# Patient Record
Sex: Male | Born: 1965 | Race: White | Hispanic: No | Marital: Single | Smoking: Former smoker
Health system: Southern US, Community
[De-identification: ages and names within clinical notes are randomized; demographics above are authoritative.]

## PROBLEM LIST (undated history)

## (undated) DIAGNOSIS — E782 Mixed hyperlipidemia: Secondary | ICD-10-CM

## (undated) DIAGNOSIS — G43909 Migraine, unspecified, not intractable, without status migrainosus: Secondary | ICD-10-CM

## (undated) DIAGNOSIS — K219 Gastro-esophageal reflux disease without esophagitis: Secondary | ICD-10-CM

## (undated) DIAGNOSIS — I25118 Atherosclerotic heart disease of native coronary artery with other forms of angina pectoris: Secondary | ICD-10-CM

## (undated) HISTORY — DX: Migraine, unspecified, not intractable, without status migrainosus: G43.909

## (undated) HISTORY — DX: Gastro-esophageal reflux disease without esophagitis: K21.9

## (undated) HISTORY — DX: Atherosclerotic heart disease of native coronary artery with other forms of angina pectoris: I25.118

## (undated) HISTORY — DX: Mixed hyperlipidemia: E78.2

---

## 2015-04-04 DIAGNOSIS — G44209 Tension-type headache, unspecified, not intractable: Secondary | ICD-10-CM | POA: Insufficient documentation

## 2015-04-04 HISTORY — DX: Tension-type headache, unspecified, not intractable: G44.209

## 2018-06-15 ENCOUNTER — Other Ambulatory Visit: Payer: Self-pay

## 2018-06-15 ENCOUNTER — Ambulatory Visit: Payer: 59 | Admitting: Podiatry

## 2018-06-15 ENCOUNTER — Ambulatory Visit (INDEPENDENT_AMBULATORY_CARE_PROVIDER_SITE_OTHER): Payer: 59

## 2018-06-15 DIAGNOSIS — M7752 Other enthesopathy of left foot: Secondary | ICD-10-CM

## 2018-06-15 DIAGNOSIS — M722 Plantar fascial fibromatosis: Secondary | ICD-10-CM

## 2018-06-15 DIAGNOSIS — M216X9 Other acquired deformities of unspecified foot: Secondary | ICD-10-CM | POA: Diagnosis not present

## 2018-06-15 DIAGNOSIS — M79672 Pain in left foot: Secondary | ICD-10-CM | POA: Diagnosis not present

## 2018-06-15 MED ORDER — MELOXICAM 15 MG PO TABS: 15.0000 mg | ORAL_TABLET | Freq: Every day | ORAL | 0 refills | Status: DC

## 2018-06-15 NOTE — Patient Instructions (Signed)

## 2018-06-15 NOTE — Progress Notes (Signed)
   Subjective:    Patient ID: Matthew Patel, male    DOB: 01-Jul-1966, 52 y.o.   MRN: [id number]  HPI    Review of Systems  Musculoskeletal: Positive for arthralgias and myalgias.  All other systems reviewed and are negative.      Objective:   Physical Exam        Assessment & Plan:

## 2018-06-21 NOTE — Progress Notes (Signed)
  Subjective:  Patient ID: Matthew Patel, male    DOB: 03-May-1966,  MRN: [id number]  Chief Complaint  Patient presents with  . Foot Pain    L mid arch and bottoom heel x 2 mo; 6/10 stabbing pain -no injury Tx: ibuprofen -the more I walk or stand on it the pain gets worst   52 y.o. male presents with the above complaint.  History as above  Review of Systems: Negative except as noted in the HPI. Denies N/V/F/Ch.  No past medical history on file.  Current Outpatient Medications:  .  Adalimumab 40 MG/0.8ML PSKT, Inject into the skin., Disp: , Rfl:  .  predniSONE (STERAPRED UNI-PAK 21 TAB) 5 MG (21) TBPK tablet, TAKE 6 TABLETS ON DAY 1 AS DIRECTED ON PACKAGE AND DECREASE BY 1 TAB EACH DAY FOR A TOTAL OF 6 DAYS, Disp: , Rfl: 0 .  TREMFYA 100 MG/ML SOSY, , Disp: , Rfl:  .  meloxicam (MOBIC) 15 MG tablet, Take 1 tablet (15 mg total) by mouth daily., Disp: 30 tablet, Rfl: 0  Social History   Tobacco Use  Smoking Status Not on file    Allergies  Allergen Reactions  . Ranitidine Swelling   Objective:  There were no vitals filed for this visit. There is no height or weight on file to calculate BMI. Constitutional Well developed. Well nourished.  Vascular Dorsalis pedis pulses palpable bilaterally. Posterior tibial pulses palpable bilaterally. Capillary refill normal to all digits.  No cyanosis or clubbing noted. Pedal hair growth normal.  Neurologic Normal speech. Oriented to person, place, and time. Epicritic sensation to light touch grossly present bilaterally.  Dermatologic Nails well groomed and normal in appearance. No open wounds. No skin lesions.  Orthopedic: Normal joint ROM without pain or crepitus bilaterally. No visible deformities. Tender to palpation at the calcaneal tuber left. No pain with calcaneal squeeze left. Ankle ROM diminished range of motion left. Silfverskiold Test: positive left.   Radiographs: Taken and reviewed. No acute fractures or dislocations.  No evidence of stress fracture.  Plantar heel spur absent. Posterior heel spur present.   Assessment:   1. Plantar fasciitis   2. Equinus deformity of foot   3. Bursitis of heel, left   4. Pain of left heel    Plan:  Patient was evaluated and treated and all questions answered.  Plantar Fasciitis, left - XR reviewed as above.  - Educated on icing and stretching. Instructions given.  - Injection delivered to the plantar fascia as below. - Pharmacologic management: Meloxicam. Educated on risks/benefits and proper taking of medication.  Procedure: Injection Tendon/Ligament Location: Left plantar fascia at the glabrous junction; medial approach. Skin Prep: alcohol Injectate: 1 cc 0.5% marcaine plain, 1 cc dexamethasone phosphate, 0.5 cc kenalog 10. Disposition: Patient tolerated procedure well. Injection site dressed with a band-aid.  No follow-ups on file.

## 2018-06-22 ENCOUNTER — Other Ambulatory Visit: Payer: Self-pay | Admitting: Podiatry

## 2018-06-22 DIAGNOSIS — M722 Plantar fascial fibromatosis: Secondary | ICD-10-CM

## 2018-06-22 DIAGNOSIS — M216X9 Other acquired deformities of unspecified foot: Secondary | ICD-10-CM

## 2018-06-22 DIAGNOSIS — M7752 Other enthesopathy of left foot: Secondary | ICD-10-CM

## 2018-07-06 ENCOUNTER — Ambulatory Visit: Payer: 59 | Admitting: Podiatry

## 2018-07-07 ENCOUNTER — Other Ambulatory Visit: Payer: Self-pay | Admitting: Podiatry

## 2018-08-22 ENCOUNTER — Other Ambulatory Visit: Payer: Self-pay | Admitting: Podiatry

## 2019-11-12 ENCOUNTER — Encounter: Payer: Self-pay | Admitting: Legal Medicine

## 2019-11-12 ENCOUNTER — Ambulatory Visit: Payer: Managed Care, Other (non HMO) | Admitting: Legal Medicine

## 2019-11-12 ENCOUNTER — Other Ambulatory Visit: Payer: Self-pay

## 2019-11-12 DIAGNOSIS — E782 Mixed hyperlipidemia: Secondary | ICD-10-CM

## 2019-11-12 DIAGNOSIS — K2100 Gastro-esophageal reflux disease with esophagitis, without bleeding: Secondary | ICD-10-CM

## 2019-11-12 DIAGNOSIS — K219 Gastro-esophageal reflux disease without esophagitis: Secondary | ICD-10-CM | POA: Insufficient documentation

## 2019-11-12 DIAGNOSIS — J449 Chronic obstructive pulmonary disease, unspecified: Secondary | ICD-10-CM

## 2019-11-12 DIAGNOSIS — J4489 Other specified chronic obstructive pulmonary disease: Secondary | ICD-10-CM

## 2019-11-12 HISTORY — DX: Chronic obstructive pulmonary disease, unspecified: J44.9

## 2019-11-12 LAB — PULMONARY FUNCTION TEST
FEV1/FVC: 48 %
FEV1: 0.15 L
FVC: 0.18 L

## 2019-11-12 NOTE — Assessment & Plan Note (Signed)

## 2019-11-12 NOTE — Patient Instructions (Signed)

## 2019-11-12 NOTE — Assessment & Plan Note (Signed)
Plan of care was formulated today.  She is doing poorly.  A plan of care was formulated using patient exam, tests and other sources to optimize care using evidence based information.  Recommend no smoking, no eating after supper, avoid fatty foods, elevate Head of bed, avoid tight fitting clothing.  Continue on dexilant.

## 2019-11-12 NOTE — Progress Notes (Signed)
Established Patient Office Visit  Subjective:  Patient ID: Matthew Patel, male    DOB: September 01, 1965  Age: 54 y.o. MRN: [id number]  CC:  Chief Complaint  Patient presents with  . Gastroesophageal Reflux    HPI Quaid Yeakle Gural presents for reflux, he complains of SOB and low O2 sat.  He is a chronic smoker and smells heavily of cigarette smoke.  Patient has gastroesophageal reflux symptoms withesophagitis and LTRD.  The symptoms are severe intensity.  Length of symptoms 5 years.  Medicines include prilosec '40mg'$ .  Complications include possible stricture.  Past Medical History:  Diagnosis Date  . GERD (gastroesophageal reflux disease)   . Migraines   . Mixed hyperlipidemia     History reviewed. No pertinent surgical history.  Family History  Family history unknown: Yes    Social History   Socioeconomic History  . Marital status: Single    Spouse name: Not on file  . Number of children: Not on file  . Years of education: Not on file  . Highest education level: Not on file  Occupational History  . Not on file  Tobacco Use  . Smoking status: Current Every Day Smoker    Packs/day: 1.50    Types: Cigarettes  . Smokeless tobacco: Never Used  Substance and Sexual Activity  . Alcohol use: Never  . Drug use: Never  . Sexual activity: Not on file  Other Topics Concern  . Not on file  Social History Narrative  . Not on file   Social Determinants of Health   Financial Resource Strain:   . Difficulty of Paying Living Expenses:   Food Insecurity:   . Worried About Charity fundraiser in the Last Year:   . Arboriculturist in the Last Year:   Transportation Needs:   . Film/video editor (Medical):   Marland Kitchen Lack of Transportation (Non-Medical):   Physical Activity:   . Days of Exercise per Week:   . Minutes of Exercise per Session:   Stress:   . Feeling of Stress :   Social Connections:   . Frequency of Communication with Friends and Family:   . Frequency of Social  Gatherings with Friends and Family:   . Attends Religious Services:   . Active Member of Clubs or Organizations:   . Attends Archivist Meetings:   Marland Kitchen Marital Status:   Intimate Partner Violence:   . Fear of Current or Ex-Partner:   . Emotionally Abused:   Marland Kitchen Physically Abused:   . Sexually Abused:     Outpatient Medications Prior to Visit  Medication Sig Dispense Refill  . fexofenadine (ALLEGRA) 180 MG tablet Take 180 mg by mouth daily.    Marland Kitchen omeprazole (PRILOSEC) 40 MG capsule Take 40 mg by mouth 2 (two) times daily before a meal.    . Secukinumab (COSENTYX, 300 MG DOSE, [place]) Inject 300 mg into the skin once a week.    . Adalimumab 40 MG/0.8ML PSKT Inject into the skin.    . meloxicam (MOBIC) 15 MG tablet TAKE 1 TABLET BY MOUTH EVERY DAY 30 tablet 0  . predniSONE (STERAPRED UNI-PAK 21 TAB) 5 MG (21) TBPK tablet TAKE 6 TABLETS ON DAY 1 AS DIRECTED ON PACKAGE AND DECREASE BY 1 TAB EACH DAY FOR A TOTAL OF 6 DAYS  0  . TREMFYA 100 MG/ML SOSY      No facility-administered medications prior to visit.    Allergies  Allergen Reactions  . Ranitidine Swelling  ROS Review of Systems  Constitutional: Negative.   HENT: Negative.   Eyes: Negative.   Respiratory: Negative.   Cardiovascular: Negative.   Gastrointestinal:       Burning from reflux  Endocrine: Negative.   Genitourinary: Negative.   Musculoskeletal: Negative.   Hematological: Negative.   Psychiatric/Behavioral: Negative.       Objective:    Physical Exam  Constitutional: He is oriented to person, place, and time. He appears well-developed and well-nourished.  HENT:  Head: Normocephalic and atraumatic.  Right Ear: External ear normal.  Eyes: Pupils are equal, round, and reactive to light. Conjunctivae and EOM are normal.  Cardiovascular: Normal rate, regular rhythm and normal heart sounds.  Pulmonary/Chest: Effort normal and breath sounds normal.  Abdominal: Soft. Bowel sounds are normal. There is  abdominal tenderness.  Musculoskeletal:        General: Normal range of motion.  Neurological: He is alert and oriented to person, place, and time. He has normal reflexes.  Skin: Skin is dry.  Psychiatric: He has a normal mood and affect. His behavior is normal.  Vitals reviewed.   BP 118/78   Pulse 87   Temp 97.7 F (36.5 C)   Ht '5\' 7"'$  (1.702 m)   Wt 155 lb 3.2 oz (70.4 kg)   SpO2 94%   BMI 24.31 kg/m  Wt Readings from Last 3 Encounters:  11/12/19 155 lb 3.2 oz (70.4 kg)     Health Maintenance Due  Topic Date Due  . HIV Screening  Never done  . TETANUS/TDAP  Never done  . COLONOSCOPY  Never done    There are no preventive care reminders to display for this patient.  No results found for: TSH No results found for: WBC, HGB, HCT, MCV, PLT No results found for: NA, K, CHLORIDE, CO2, GLUCOSE, BUN, CREATININE, BILITOT, ALKPHOS, AST, ALT, PROT, ALBUMIN, CALCIUM, ANIONGAP, EGFR, GFR No results found for: CHOL No results found for: HDL No results found for: LDLCALC No results found for: TRIG No results found for: CHOLHDL No results found for: HGBA1C    Assessment & Plan:   Problem List Items Addressed This Visit      Respiratory   Obstructive chronic bronchitis without exacerbation ([place])    An individualize plan was formulated for care of COPD.  Treatment is evidence based.  She will continue on inhalers, avoid smoking and smoke.  Regular exercise with help with dyspnea. Routine follow ups and medication compliance is needed. Spirometry: FVC 114%, FEV1 67%, FEV1/FVC 61%.  I advised about quiting smoking      Relevant Medications   fexofenadine (ALLEGRA) 180 MG tablet     Digestive   GERD (gastroesophageal reflux disease)    Plan of care was formulated today.  She is doing poorly.  A plan of care was formulated using patient exam, tests and other sources to optimize care using evidence based information.  Recommend no smoking, no eating after supper, avoid fatty  foods, elevate Head of bed, avoid tight fitting clothing.  Continue on dexilant.      Relevant Medications   omeprazole (PRILOSEC) 40 MG capsule     Other   Mixed hyperlipidemia    AN INDIVIDUAL CARE PLAN for hyperlipidemia/ cholesterol was established and reinforced today.  The patient's status was assessed using clinical findings on exam, lab and other diagnostic tests. The patient's disease status was assessed based on evidence-based guidelines and found to be well controlled. MEDICATIONS were reviewed. SELF MANAGEMENT GOALS have been  discussed and patient's success at attaining the goal of low cholesterol was assessed. RECOMMENDATION given include regular exercise 3 days a week and low cholesterol/low fat diet. CLINICAL SUMMARY including written plan to identify barriers unique to the patient due to social or economic  reasons was discussed.         No orders of the defined types were placed in this encounter.   Follow-up: Return in about 1 week (around 11/19/2019).    Reinaldo Meeker, MD

## 2019-11-12 NOTE — Assessment & Plan Note (Signed)
An individualize plan was formulated for care of COPD.  Treatment is evidence based.  She will continue on inhalers, avoid smoking and smoke.  Regular exercise with help with dyspnea. Routine follow ups and medication compliance is needed. Spirometry: FVC 114%, FEV1 67%, FEV1/FVC 61%.  I advised about quiting smoking

## 2019-11-15 ENCOUNTER — Encounter: Payer: Self-pay | Admitting: Legal Medicine

## 2019-11-30 ENCOUNTER — Other Ambulatory Visit: Payer: Self-pay | Admitting: Legal Medicine

## 2019-11-30 ENCOUNTER — Telehealth: Payer: Self-pay

## 2019-11-30 DIAGNOSIS — K2100 Gastro-esophageal reflux disease with esophagitis, without bleeding: Secondary | ICD-10-CM

## 2019-11-30 MED ORDER — DEXLANSOPRAZOLE 60 MG PO CPDR: 60.0000 mg | DELAYED_RELEASE_CAPSULE | Freq: Every day | ORAL | 6 refills | Status: DC

## 2019-11-30 NOTE — Telephone Encounter (Signed)
Patient states samples of Dexilant work well and would like a prescription.

## 2019-12-06 ENCOUNTER — Telehealth: Payer: Self-pay

## 2019-12-06 NOTE — Telephone Encounter (Signed)
Patient called in stating that Dexilant is not covered and insurance told them no PPI is covered is there anything else that can be called in

## 2019-12-06 NOTE — Telephone Encounter (Signed)
Take pepcid 40mg  qd lp

## 2019-12-09 NOTE — Telephone Encounter (Signed)
I left detailed message on voicemail. ?

## 2019-12-16 ENCOUNTER — Telehealth: Payer: Self-pay

## 2019-12-16 MED ORDER — OMEPRAZOLE 40 MG PO CPDR: 40.0000 mg | DELAYED_RELEASE_CAPSULE | Freq: Two times a day (BID) | ORAL | 0 refills | Status: DC

## 2019-12-16 NOTE — Telephone Encounter (Signed)
Patient would like Prilosec

## 2020-01-08 ENCOUNTER — Other Ambulatory Visit: Payer: Self-pay | Admitting: Legal Medicine

## 2020-02-08 ENCOUNTER — Ambulatory Visit: Payer: Managed Care, Other (non HMO) | Admitting: Legal Medicine

## 2020-02-08 ENCOUNTER — Encounter: Payer: Self-pay | Admitting: Legal Medicine

## 2020-02-08 ENCOUNTER — Other Ambulatory Visit: Payer: Self-pay

## 2020-02-08 ENCOUNTER — Ambulatory Visit: Payer: 59 | Admitting: Podiatry

## 2020-02-08 DIAGNOSIS — G5752 Tarsal tunnel syndrome, left lower limb: Secondary | ICD-10-CM

## 2020-02-08 DIAGNOSIS — M7662 Achilles tendinitis, left leg: Secondary | ICD-10-CM

## 2020-02-08 DIAGNOSIS — M722 Plantar fascial fibromatosis: Secondary | ICD-10-CM

## 2020-02-08 HISTORY — DX: Plantar fascial fibromatosis: M72.2

## 2020-02-08 HISTORY — DX: Achilles tendinitis, left leg: M76.62

## 2020-02-08 HISTORY — DX: Tarsal tunnel syndrome, left lower limb: G57.52

## 2020-02-08 NOTE — Progress Notes (Signed)
Subjective:  Patient ID: Matthew Patel, male    DOB: Jul 20, 1966  Age: 54 y.o. MRN: [id number]  Chief Complaint  Patient presents with  . Foot Pain    2 weeks     HPIL left foot pain for 2 weeks, formerly treated with plantar fasciitis.   Current Outpatient Medications on File Prior to Visit  Medication Sig Dispense Refill  . fexofenadine (ALLEGRA) 180 MG tablet Take 180 mg by mouth daily.    Marland Kitchen omeprazole (PRILOSEC) 40 MG capsule TAKE 1 CAPSULE (40 MG TOTAL) BY MOUTH 2 (TWO) TIMES DAILY BEFORE A MEAL. 60 capsule 2  . Secukinumab (COSENTYX, 300 MG DOSE, ) Inject 300 mg into the skin once a week.     No current facility-administered medications on file prior to visit.   Past Medical History:  Diagnosis Date  . GERD (gastroesophageal reflux disease)   . Migraines   . Mixed hyperlipidemia    History reviewed. No pertinent surgical history.  Family History  Family history unknown: Yes   Social History   Socioeconomic History  . Marital status: Single    Spouse name: Not on file  . Number of children: Not on file  . Years of education: Not on file  . Highest education level: Not on file  Occupational History  . Not on file  Tobacco Use  . Smoking status: Current Every Day Smoker    Packs/day: 1.50    Types: Cigarettes  . Smokeless tobacco: Never Used  Vaping Use  . Vaping Use: Never used  Substance and Sexual Activity  . Alcohol use: Never  . Drug use: Never  . Sexual activity: Not on file  Other Topics Concern  . Not on file  Social History Narrative  . Not on file   Social Determinants of Health   Financial Resource Strain:   . Difficulty of Paying Living Expenses:   Food Insecurity:   . Worried About Programme researcher, broadcasting/film/video in the Last Year:   . Barista in the Last Year:   Transportation Needs:   . Freight forwarder (Medical):   Marland Kitchen Lack of Transportation (Non-Medical):   Physical Activity:   . Days of Exercise per Week:   . Minutes of  Exercise per Session:   Stress:   . Feeling of Stress :   Social Connections:   . Frequency of Communication with Friends and Family:   . Frequency of Social Gatherings with Friends and Family:   . Attends Religious Services:   . Active Member of Clubs or Organizations:   . Attends Banker Meetings:   Marland Kitchen Marital Status:     Review of Systems  Constitutional: Negative.   HENT: Negative.   Eyes: Negative.   Respiratory: Negative.   Cardiovascular: Negative.   Genitourinary: Negative.   Musculoskeletal:       Ain in left achillis muscle and tendon, pain over plantar fascia and positive Tinel over tarsal tunnel on left.  Neurological: Negative.      Objective:  BP 122/60   Pulse 80   Temp 97.9 F (36.6 C)   Resp 16   Ht 5\' 7"  (1.702 m)   Wt 150 lb 3.2 oz (68.1 kg)   SpO2 97%   BMI 23.52 kg/m   BP/Weight 02/08/2020 11/12/2019  Systolic BP 122 118  Diastolic BP 60 78  Wt. (Lbs) 150.2 155.2  BMI 23.52 24.31    Physical Exam Vitals reviewed.  Constitutional:  Appearance: Normal appearance.  HENT:     Head: Normocephalic and atraumatic.  Cardiovascular:     Rate and Rhythm: Normal rate and regular rhythm.     Pulses: Normal pulses.          Dorsalis pedis pulses are 2+ on the right side and 2+ on the left side.       Posterior tibial pulses are 2+ on the right side and 2+ on the left side.     Heart sounds: Normal heart sounds.  Pulmonary:     Effort: Pulmonary effort is normal.     Breath sounds: Normal breath sounds.  Musculoskeletal:       Feet:  Feet:     Comments: Pain over left plantar fascial, positive tinel over tarsal tunnel on left and pain in achillis muscle diffusely.   Skin:    Capillary Refill: Capillary refill takes less than 2 seconds.  Neurological:     General: No focal deficit present.     Mental Status: He is alert and oriented to person, place, and time.       No results found for: WBC, HGB, HCT, PLT, GLUCOSE, CHOL,  TRIG, HDL, LDLDIRECT, LDLCALC, ALT, AST, NA, K, CL, CREATININE, BUN, CO2, TSH, PSA, INR, GLUF, HGBA1C, MICROALBUR    Assessment & Plan:   1. Plantar fasciitis of left foot Patient is having recurrent plantar fasciitis for one month, injected plantar fascia.  After consent was obtained, using sterile technique the plantar fascia left  was prepped and Ethyl Chloride was used as local anesthetic. The plantar fasca left foot was injected.  Steroid 40 mg and 1/2 ml plain Lidocaine was then injected and the needle withdrawn.  The procedure was well tolerated.  The patient is asked to continue to rest the joint for a few more days before resuming regular activities.  It may be more painful for the first 1-2 days.  Watch for fever, or increased swelling or persistent pain in the joint. Call or return to clinic prn if such symptoms occur or there is failure to improve as anticipated.  2. Tendonitis, Achilles, left He has pain in achillis muscle with increase at origin and insertion of tendon, may need walker boot that will affect his work  3. Tarsal tunnel syndrome of left side Patient has positive tinel at tarsal tunnel         Follow-up: Return in about 2 weeks (around 02/22/2020), or if symptoms worsen or fail to improve.  An After Visit Summary was printed and given to the patient.  [facility]rent Bulla [facility] [phone]

## 2020-02-14 ENCOUNTER — Ambulatory Visit: Payer: Managed Care, Other (non HMO) | Admitting: Podiatry

## 2020-02-15 ENCOUNTER — Ambulatory Visit (INDEPENDENT_AMBULATORY_CARE_PROVIDER_SITE_OTHER): Payer: Managed Care, Other (non HMO)

## 2020-02-15 ENCOUNTER — Other Ambulatory Visit: Payer: Self-pay

## 2020-02-15 ENCOUNTER — Other Ambulatory Visit: Payer: Self-pay | Admitting: Podiatry

## 2020-02-15 ENCOUNTER — Ambulatory Visit: Payer: Managed Care, Other (non HMO) | Admitting: Podiatry

## 2020-02-15 DIAGNOSIS — M216X9 Other acquired deformities of unspecified foot: Secondary | ICD-10-CM | POA: Diagnosis not present

## 2020-02-15 DIAGNOSIS — M7662 Achilles tendinitis, left leg: Secondary | ICD-10-CM

## 2020-02-15 DIAGNOSIS — M722 Plantar fascial fibromatosis: Secondary | ICD-10-CM

## 2020-02-15 DIAGNOSIS — M79672 Pain in left foot: Secondary | ICD-10-CM

## 2020-02-15 MED ORDER — METHYLPREDNISOLONE 4 MG PO TBPK: ORAL_TABLET | ORAL | 0 refills | Status: DC

## 2020-02-15 NOTE — Progress Notes (Signed)
  Subjective:  Patient ID: Matthew Patel, male    DOB: 1965/12/22,  MRN: [id number]  Chief Complaint  Patient presents with  . Foot Pain    flare up apin at Lt back heel and medial arch x flar up before July 4th; 9/10 shapr occasional pains -no injuyr/swelling -worse with walking tx: resting, IBU -pain radiates to back of knee -sore at calf, with burning and numbness    54 y.o. male presents with the above complaint. History confirmed with patient.   Objective:  Physical Exam: warm, good capillary refill, no trophic changes or ulcerative lesions, normal DP and PT pulses, and normal sensory exam. Left Foot: tenderness to palpation medial calcaneal tuber, tenderness to palpation posterior calcaneus, no pain with calcaneal squeeze, decreased ankle joint ROM, and +Silverskiold test  Radiographs: X-ray of the left foot: no evidence of calcaneal stress fracture, no plantar calcaneal heel spur, and Haglund deformity noted  Assessment:  1. Plantar fasciitis   2. Equinus deformity of foot   3. Achilles tendinitis of left lower extremity    Plan:  Patient was evaluated and treated and all questions answered.  Achilles Tendonitis and Plantar Fasciitis -XR reviewed with patient -Educated on stretching and icing of the affected limb. -Night splint dispensed. -Rx for Medrol 6-day taper. Advised on risks, benefits, and alternatives of the medication  Return in about 3 weeks (around 03/07/2020) for Plantar fasciitis, Tendonitis.

## 2020-02-15 NOTE — Patient Instructions (Signed)

## 2020-02-22 ENCOUNTER — Other Ambulatory Visit: Payer: Self-pay | Admitting: Podiatry

## 2020-02-22 DIAGNOSIS — M722 Plantar fascial fibromatosis: Secondary | ICD-10-CM

## 2020-03-09 ENCOUNTER — Ambulatory Visit (INDEPENDENT_AMBULATORY_CARE_PROVIDER_SITE_OTHER): Payer: Managed Care, Other (non HMO) | Admitting: Podiatry

## 2020-03-09 ENCOUNTER — Ambulatory Visit: Payer: Managed Care, Other (non HMO) | Admitting: Podiatry

## 2020-03-09 ENCOUNTER — Other Ambulatory Visit: Payer: Self-pay

## 2020-03-09 DIAGNOSIS — M722 Plantar fascial fibromatosis: Secondary | ICD-10-CM | POA: Diagnosis not present

## 2020-03-09 NOTE — Progress Notes (Signed)
  Subjective:  Patient ID: Matthew Patel, male    DOB: 03/18/1966,  MRN: [id number]  Chief Complaint  Patient presents with  . Plantar Fasciitis    F?U LT tendonitis and PF Pt. states," about the same, someitmes better, if on it a lot it still hurts and tinlges; 5-6/10." no swelling tx: NS (makes it hurt)     54 y.o. male presents with the above complaint. History confirmed with patient.   Objective:  Physical Exam: warm, good capillary refill, no trophic changes or ulcerative lesions, normal DP and PT pulses, and normal sensory exam. Left Foot: tenderness to palpation medial calcaneal tuber, tenderness to palpation posterior calcaneus, no pain with calcaneal squeeze, decreased ankle joint ROM, and +Silverskiold test   Assessment:  1. Plantar fasciitis    Plan:  Patient was evaluated and treated and all questions answered.  Achilles Tendonitis and Plantar Fasciitis  -Heel pads dispensed -Educated patient on continued stretching and icing of the affected limb -Injection delivered to the plantar fascia of the right foot.  Procedure: Injection Tendon/Ligament Consent: Verbal consent obtained. Location: Left plantar fascia at the glabrous junction; medial approach. Skin Prep: Alcohol. Injectate: 1 cc 0.5% marcaine plain, 1 cc dexamethasone phosphate, 0.5 cc kenalog 10. Disposition: Patient tolerated procedure well. Injection site dressed with a band-aid.  Return in about 1 month (around 04/09/2020) for Plantar fasciitis.

## 2020-03-16 ENCOUNTER — Ambulatory Visit: Payer: Managed Care, Other (non HMO) | Admitting: Podiatry

## 2020-04-10 ENCOUNTER — Ambulatory Visit: Payer: Managed Care, Other (non HMO) | Admitting: Podiatry

## 2020-04-17 ENCOUNTER — Ambulatory Visit: Payer: Managed Care, Other (non HMO) | Admitting: Podiatry

## 2020-04-17 ENCOUNTER — Encounter: Payer: Self-pay | Admitting: Podiatry

## 2020-04-17 ENCOUNTER — Other Ambulatory Visit: Payer: Self-pay

## 2020-04-17 DIAGNOSIS — M216X9 Other acquired deformities of unspecified foot: Secondary | ICD-10-CM

## 2020-04-17 DIAGNOSIS — M7662 Achilles tendinitis, left leg: Secondary | ICD-10-CM

## 2020-04-17 DIAGNOSIS — M722 Plantar fascial fibromatosis: Secondary | ICD-10-CM

## 2020-04-17 NOTE — Progress Notes (Signed)
  Subjective:  Patient ID: Matthew Patel, male    DOB: 1965/12/07,  MRN: [id number]  No chief complaint on file.   54 y.o. male presents with the above complaint. States the left foot is doing better, the injection helped but pain still comes and goes. Heel pads helped as well but still feels like the heel cord is tight. States it hurts to walk for a long period of time.  Objective:  Physical Exam: warm, good capillary refill, no trophic changes or ulcerative lesions, normal DP and PT pulses, and normal sensory exam. Left Foot: tenderness to palpation medial calcaneal tuber, tenderness to palpation posterior calcaneus, no pain with calcaneal squeeze, decreased ankle joint ROM, and +Silverskiold test   Assessment:   1. Plantar fasciitis   2. Equinus deformity of foot   3. Achilles tendinitis of left lower extremity    Plan:  Patient was evaluated and treated and all questions answered.  Achilles Tendonitis and Plantar Fasciitis  -Continue stretching and icing -Injection delivered as below   Procedure: Injection Tendon/Ligament Consent: Verbal consent obtained. Location: Left plantar fascia at the glabrous junction; medial approach. Skin Prep: Alcohol. Injectate: 1 cc 0.5% marcaine plain, 1 cc dexamethasone phosphate, 0.5 cc kenalog 10. Disposition: Patient tolerated procedure well. Injection site dressed with a band-aid.     Return in about 4 weeks (around 05/15/2020) for Plantar fasciitis, Left.

## 2020-05-22 ENCOUNTER — Encounter: Payer: Self-pay | Admitting: Podiatry

## 2020-05-22 ENCOUNTER — Other Ambulatory Visit: Payer: Self-pay

## 2020-05-22 ENCOUNTER — Ambulatory Visit: Payer: Managed Care, Other (non HMO) | Admitting: Podiatry

## 2020-05-22 DIAGNOSIS — M722 Plantar fascial fibromatosis: Secondary | ICD-10-CM | POA: Diagnosis not present

## 2020-05-22 NOTE — Progress Notes (Signed)
  Subjective:  Patient ID: Matthew Patel, male    DOB: 09/16/1965,  MRN: [id number]  Chief Complaint  Patient presents with  . Plantar Fasciitis    the injection maybe helped a little on the left   . Foot Problem    i have some skin that is peeling on the left heel     54 y.o. male presents with the above complaint.  History above confirmed with patient  Objective:  Physical Exam: warm, good capillary refill, no trophic changes or ulcerative lesions, normal DP and PT pulses, and normal sensory exam. Left Foot: tenderness to palpation medial calcaneal tuber, tenderness to palpation posterior calcaneus, no pain with calcaneal squeeze, decreased ankle joint ROM, and +Silverskiold test   Assessment:   1. Plantar fasciitis    Plan:  Patient was evaluated and treated and all questions answered.  Achilles Tendonitis and Plantar Fasciitis  -Continue stretching and icing -Repeat injection as below   Procedure: Injection Tendon/Ligament Consent: Verbal consent obtained. Location: Left plantar fascia at the glabrous junction; medial approach. Skin Prep: Alcohol. Injectate: 1 cc 0.5% marcaine plain, 1 cc dexamethasone phosphate, 0.5 cc kenalog 10. Disposition: Patient tolerated procedure well. Injection site dressed with a band-aid.     Return in about 1 month (around 06/22/2020) for Plantar fasciitis.

## 2020-06-26 ENCOUNTER — Encounter: Payer: Self-pay | Admitting: Podiatry

## 2020-06-26 ENCOUNTER — Ambulatory Visit: Payer: Managed Care, Other (non HMO) | Admitting: Podiatry

## 2020-06-26 ENCOUNTER — Other Ambulatory Visit: Payer: Self-pay

## 2020-06-26 DIAGNOSIS — M722 Plantar fascial fibromatosis: Secondary | ICD-10-CM

## 2020-06-26 DIAGNOSIS — Z720 Tobacco use: Secondary | ICD-10-CM | POA: Diagnosis not present

## 2020-06-26 DIAGNOSIS — M216X9 Other acquired deformities of unspecified foot: Secondary | ICD-10-CM | POA: Diagnosis not present

## 2020-06-26 DIAGNOSIS — M7662 Achilles tendinitis, left leg: Secondary | ICD-10-CM | POA: Diagnosis not present

## 2020-06-26 NOTE — Progress Notes (Addendum)
  Subjective:  Patient ID: Matthew Patel, male    DOB: 1966/02/09,  MRN: [id number]  Chief Complaint  Patient presents with  . Plantar Fasciitis    the left heel has it's good and bad days and nothing is helping   54 y.o. male presents with the above complaint.  History above confirmed with patient  Objective:  Physical Exam: warm, good capillary refill, no trophic changes or ulcerative lesions, normal DP and PT pulses, and normal sensory exam. Left Foot: tenderness to palpation medial calcaneal tuber, tenderness to palpation posterior calcaneus, no pain with calcaneal squeeze, decreased ankle joint ROM, and +Silverskiold test. POP medial border left hallux.   Assessment:   1. Plantar fasciitis   2. Equinus deformity of foot   3. Achilles tendinitis of left lower extremity   4. Tobacco use    Plan:  Patient was evaluated and treated and all questions answered.  Achilles Tendonitis and Plantar Fasciitis  -Discussed surgical intervention with patient as he has failed aggressive conservative therapy of injection, stretching exercises, offloading, bracing. Offered PT, patient declined. -Patient has failed all conservative therapy and wishes to proceed with surgical intervention. All risks, benefits, and alternatives discussed with patient. No guarantees given. Consent reviewed and signed by patient. -Planned procedures: left foot plantar fasciotomy, Gastrocnemius recession - open vs endoscopic. -Risk factors: tobacco use. -Boot dispensed for post-op use.  Ingrown Nail left hallux -Debrided in slant back fashion to patient relief.  Return for post-op care.

## 2020-07-03 ENCOUNTER — Telehealth: Payer: Self-pay | Admitting: Podiatry

## 2020-07-03 NOTE — Telephone Encounter (Signed)
DOS: 07/26/2020  Procedures: Endoscopic Plantar Fasciotomy Lt ([id number]) & Gastrocnemius Recess Lt [phone])  [employer] Effective From: 07/29/2018 -  Deductible: $4,000 with $431.25 met and $3,568.75 remaining. Out of Pocket: $6,600 with $875.19 met and $5,724.81 remaining. CoInsurance: 20% Copay: $  Per [employer] [facility] no Prior Authorization is required. Call Reference Number [id number].

## 2020-07-04 ENCOUNTER — Inpatient Hospital Stay (HOSPITAL_COMMUNITY): Payer: Managed Care, Other (non HMO)

## 2020-07-04 ENCOUNTER — Inpatient Hospital Stay (HOSPITAL_COMMUNITY)
Admission: EM | Admit: 2020-07-04 | Discharge: 2020-07-05 | DRG: 247 | Disposition: A | Discharge status: Home / Self Care | Payer: Managed Care, Other (non HMO) | Attending: Cardiovascular Disease | Admitting: Cardiovascular Disease

## 2020-07-04 ENCOUNTER — Encounter (HOSPITAL_COMMUNITY)
Admission: EM | Disposition: A | Discharge status: Home / Self Care | Payer: Self-pay | Source: Home / Self Care | Attending: Cardiovascular Disease

## 2020-07-04 ENCOUNTER — Emergency Department (HOSPITAL_COMMUNITY): Payer: Managed Care, Other (non HMO)

## 2020-07-04 ENCOUNTER — Other Ambulatory Visit: Payer: Self-pay

## 2020-07-04 ENCOUNTER — Encounter (HOSPITAL_COMMUNITY): Payer: Self-pay

## 2020-07-04 DIAGNOSIS — E782 Mixed hyperlipidemia: Secondary | ICD-10-CM | POA: Diagnosis present

## 2020-07-04 DIAGNOSIS — L7632 Postprocedural hematoma of skin and subcutaneous tissue following other procedure: Secondary | ICD-10-CM | POA: Diagnosis not present

## 2020-07-04 DIAGNOSIS — Z95820 Peripheral vascular angioplasty status with implants and grafts: Secondary | ICD-10-CM

## 2020-07-04 DIAGNOSIS — Z716 Tobacco abuse counseling: Secondary | ICD-10-CM | POA: Diagnosis not present

## 2020-07-04 DIAGNOSIS — I255 Ischemic cardiomyopathy: Secondary | ICD-10-CM | POA: Diagnosis present

## 2020-07-04 DIAGNOSIS — R079 Chest pain, unspecified: Secondary | ICD-10-CM | POA: Diagnosis present

## 2020-07-04 DIAGNOSIS — Z8249 Family history of ischemic heart disease and other diseases of the circulatory system: Secondary | ICD-10-CM

## 2020-07-04 DIAGNOSIS — L409 Psoriasis, unspecified: Secondary | ICD-10-CM | POA: Diagnosis present

## 2020-07-04 DIAGNOSIS — Z720 Tobacco use: Secondary | ICD-10-CM | POA: Diagnosis present

## 2020-07-04 DIAGNOSIS — I2111 ST elevation (STEMI) myocardial infarction involving right coronary artery: Secondary | ICD-10-CM

## 2020-07-04 DIAGNOSIS — J438 Other emphysema: Secondary | ICD-10-CM

## 2020-07-04 DIAGNOSIS — Z79899 Other long term (current) drug therapy: Secondary | ICD-10-CM | POA: Diagnosis not present

## 2020-07-04 DIAGNOSIS — I25118 Atherosclerotic heart disease of native coronary artery with other forms of angina pectoris: Secondary | ICD-10-CM | POA: Diagnosis present

## 2020-07-04 DIAGNOSIS — Z955 Presence of coronary angioplasty implant and graft: Secondary | ICD-10-CM

## 2020-07-04 DIAGNOSIS — Z20822 Contact with and (suspected) exposure to covid-19: Secondary | ICD-10-CM | POA: Diagnosis present

## 2020-07-04 DIAGNOSIS — J449 Chronic obstructive pulmonary disease, unspecified: Secondary | ICD-10-CM | POA: Diagnosis present

## 2020-07-04 DIAGNOSIS — Y838 Other surgical procedures as the cause of abnormal reaction of the patient, or of later complication, without mention of misadventure at the time of the procedure: Secondary | ICD-10-CM | POA: Diagnosis not present

## 2020-07-04 DIAGNOSIS — F17210 Nicotine dependence, cigarettes, uncomplicated: Secondary | ICD-10-CM | POA: Diagnosis present

## 2020-07-04 DIAGNOSIS — I442 Atrioventricular block, complete: Secondary | ICD-10-CM | POA: Diagnosis not present

## 2020-07-04 DIAGNOSIS — I213 ST elevation (STEMI) myocardial infarction of unspecified site: Secondary | ICD-10-CM

## 2020-07-04 DIAGNOSIS — I219 Acute myocardial infarction, unspecified: Secondary | ICD-10-CM | POA: Diagnosis present

## 2020-07-04 DIAGNOSIS — I2510 Atherosclerotic heart disease of native coronary artery without angina pectoris: Secondary | ICD-10-CM | POA: Diagnosis present

## 2020-07-04 DIAGNOSIS — K219 Gastro-esophageal reflux disease without esophagitis: Secondary | ICD-10-CM | POA: Diagnosis present

## 2020-07-04 HISTORY — PX: LEFT HEART CATH AND CORONARY ANGIOGRAPHY: CATH118249

## 2020-07-04 HISTORY — PX: CORONARY/GRAFT ACUTE MI REVASCULARIZATION: CATH118305

## 2020-07-04 LAB — CBC WITH DIFFERENTIAL/PLATELET
Abs Immature Granulocytes: 0.07 10*3/uL (ref 0.00–0.07)
Basophils Absolute: 0.1 10*3/uL (ref 0.0–0.1)
Basophils Relative: 1 %
Eosinophils Absolute: 0.5 10*3/uL (ref 0.0–0.5)
Eosinophils Relative: 3 %
HCT: 43.6 % (ref 39.0–52.0)
Hemoglobin: 14.8 g/dL (ref 13.0–17.0)
Immature Granulocytes: 1 %
Lymphocytes Relative: 26 %
Lymphs Abs: 4 10*3/uL (ref 0.7–4.0)
MCH: 32.2 pg (ref 26.0–34.0)
MCHC: 33.9 g/dL (ref 30.0–36.0)
MCV: 95 fL (ref 80.0–100.0)
Monocytes Absolute: 1.1 10*3/uL — ABNORMAL HIGH (ref 0.1–1.0)
Monocytes Relative: 7 %
Neutro Abs: 9.8 10*3/uL — ABNORMAL HIGH (ref 1.7–7.7)
Neutrophils Relative %: 62 %
Platelets: 288 10*3/uL (ref 150–400)
RBC: 4.59 MIL/uL (ref 4.22–5.81)
RDW: 12.7 % (ref 11.5–15.5)
WBC: 15.5 10*3/uL — ABNORMAL HIGH (ref 4.0–10.5)
nRBC: 0 % (ref 0.0–0.2)

## 2020-07-04 LAB — BASIC METABOLIC PANEL
Anion gap: 12 (ref 5–15)
BUN: 12 mg/dL (ref 6–20)
CO2: 22 mmol/L (ref 22–32)
Calcium: 9.4 mg/dL (ref 8.9–10.3)
Chloride: 105 mmol/L (ref 98–111)
Creatinine, Ser: 0.81 mg/dL (ref 0.61–1.24)
GFR, Estimated: >60 mL/min (ref >60)
Glucose, Bld: 167 mg/dL — ABNORMAL HIGH (ref 70–99)
Potassium: 3.7 mmol/L (ref 3.5–5.1)
Sodium: 139 mmol/L (ref 135–145)

## 2020-07-04 LAB — CBC
HCT: 37.8 % — ABNORMAL LOW (ref 39.0–52.0)
Hemoglobin: 13.7 g/dL (ref 13.0–17.0)
MCH: 33.3 pg (ref 26.0–34.0)
MCHC: 36.2 g/dL — ABNORMAL HIGH (ref 30.0–36.0)
MCV: 92 fL (ref 80.0–100.0)
Platelets: 308 10*3/uL (ref 150–400)
RBC: 4.11 MIL/uL — ABNORMAL LOW (ref 4.22–5.81)
RDW: 12.6 % (ref 11.5–15.5)
WBC: 14 10*3/uL — ABNORMAL HIGH (ref 4.0–10.5)
nRBC: 0 % (ref 0.0–0.2)

## 2020-07-04 LAB — ECHOCARDIOGRAM COMPLETE
Area-P 1/2: 2.51 cm2
Calc EF: 42.2 %
Height: 70 in
S' Lateral: 4 cm
Single Plane A2C EF: 39.7 %
Single Plane A4C EF: 47.3 %
Weight: 2483.26 oz

## 2020-07-04 LAB — COMPREHENSIVE METABOLIC PANEL
ALT: 18 U/L (ref 0–44)
AST: 15 U/L (ref 15–41)
Albumin: 4.8 g/dL (ref 3.5–5.0)
Alkaline Phosphatase: 55 U/L (ref 38–126)
Anion gap: 9 (ref 5–15)
BUN: 14 mg/dL (ref 6–20)
CO2: 29 mmol/L (ref 22–32)
Calcium: 10 mg/dL (ref 8.9–10.3)
Chloride: 102 mmol/L (ref 98–111)
Creatinine, Ser: 0.7 mg/dL (ref 0.61–1.24)
GFR, Estimated: >60 mL/min (ref >60)
Glucose, Bld: 119 mg/dL — ABNORMAL HIGH (ref 70–99)
Potassium: 3.4 mmol/L — ABNORMAL LOW (ref 3.5–5.1)
Sodium: 140 mmol/L (ref 135–145)
Total Bilirubin: 0.5 mg/dL (ref 0.3–1.2)
Total Protein: 8.1 g/dL (ref 6.5–8.1)

## 2020-07-04 LAB — LIPID PANEL
Cholesterol: 178 mg/dL (ref 0–200)
HDL: 31 mg/dL — ABNORMAL LOW (ref >40)
LDL Cholesterol: 112 mg/dL — ABNORMAL HIGH (ref 0–99)
Total CHOL/HDL Ratio: 5.7 RATIO
Triglycerides: 174 mg/dL — ABNORMAL HIGH (ref <150)
VLDL: 35 mg/dL (ref 0–40)

## 2020-07-04 LAB — APTT: aPTT: 24 seconds (ref 24–36)

## 2020-07-04 LAB — MRSA PCR SCREENING: MRSA by PCR: NEGATIVE

## 2020-07-04 LAB — RESP PANEL BY RT-PCR (FLU A&B, COVID) ARPGX2
Influenza A by PCR: NEGATIVE
Influenza B by PCR: NEGATIVE
SARS Coronavirus 2 by RT PCR: NEGATIVE

## 2020-07-04 LAB — POCT ACTIVATED CLOTTING TIME
Activated Clotting Time: 267 seconds
Activated Clotting Time: 279 seconds
Activated Clotting Time: 547 seconds

## 2020-07-04 LAB — TROPONIN I (HIGH SENSITIVITY)
Troponin I (High Sensitivity): 268 ng/L (ref <18)
Troponin I (High Sensitivity): >27000 ng/L (ref <18)
Troponin I (High Sensitivity): >27000 ng/L (ref <18)

## 2020-07-04 LAB — HEMOGLOBIN A1C
Hgb A1c MFr Bld: 5.8 % — ABNORMAL HIGH (ref 4.8–5.6)
Mean Plasma Glucose: 119.76 mg/dL

## 2020-07-04 LAB — PROTIME-INR
INR: 1 (ref 0.8–1.2)
Prothrombin Time: 12.4 seconds (ref 11.4–15.2)

## 2020-07-04 SURGERY — CORONARY/GRAFT ACUTE MI REVASCULARIZATION
Anesthesia: LOCAL

## 2020-07-04 MED ORDER — HEPARIN (PORCINE) IN NACL 1000-0.9 UT/500ML-% IV SOLN
INTRAVENOUS | Status: DC | PRN
  Administered 2020-07-04 (×3): 500 mL

## 2020-07-04 MED ORDER — TIROFIBAN HCL IN NACL 5-0.9 MG/100ML-% IV SOLN
INTRAVENOUS | Status: AC | PRN
  Administered 2020-07-04: 0.15 ug/kg/min via INTRAVENOUS

## 2020-07-04 MED ORDER — HEPARIN SODIUM (PORCINE) 1000 UNIT/ML IJ SOLN
INTRAMUSCULAR | Status: DC | PRN
  Administered 2020-07-04 (×2): 2000 [IU] via INTRAVENOUS
  Administered 2020-07-04: 4000 [IU] via INTRAVENOUS

## 2020-07-04 MED ORDER — SODIUM CHLORIDE 0.9 % IV SOLN: INTRAVENOUS | Status: DC

## 2020-07-04 MED ORDER — NITROGLYCERIN 0.4 MG SL SUBL
SUBLINGUAL_TABLET | SUBLINGUAL | Status: AC
  Filled 2020-07-04: qty 1

## 2020-07-04 MED ORDER — MORPHINE SULFATE (PF) 2 MG/ML IV SOLN
INTRAVENOUS | Status: AC
  Filled 2020-07-04: qty 1

## 2020-07-04 MED ORDER — TIROFIBAN (AGGRASTAT) BOLUS VIA INFUSION
INTRAVENOUS | Status: DC | PRN
  Administered 2020-07-04: 1667.5 ug via INTRAVENOUS

## 2020-07-04 MED ORDER — TIROFIBAN HCL IN NACL 5-0.9 MG/100ML-% IV SOLN
0.1500 ug/kg/min | INTRAVENOUS | Status: AC
  Administered 2020-07-04 (×2): 0.15 ug/kg/min via INTRAVENOUS
  Filled 2020-07-04 (×2): qty 100

## 2020-07-04 MED ORDER — MORPHINE SULFATE (PF) 2 MG/ML IV SOLN
2.0000 mg | Freq: Once | INTRAVENOUS | Status: AC
  Administered 2020-07-04: 2 mg via INTRAVENOUS

## 2020-07-04 MED ORDER — ATORVASTATIN CALCIUM 80 MG PO TABS
80.0000 mg | ORAL_TABLET | Freq: Every day | ORAL | Status: DC
  Administered 2020-07-04 – 2020-07-05 (×2): 80 mg via ORAL
  Filled 2020-07-04 (×2): qty 1

## 2020-07-04 MED ORDER — ONDANSETRON HCL 4 MG/2ML IJ SOLN: 4.0000 mg | Freq: Four times a day (QID) | INTRAMUSCULAR | Status: DC | PRN

## 2020-07-04 MED ORDER — PANTOPRAZOLE SODIUM 40 MG PO TBEC
40.0000 mg | DELAYED_RELEASE_TABLET | Freq: Two times a day (BID) | ORAL | Status: DC
  Administered 2020-07-04 – 2020-07-05 (×2): 40 mg via ORAL
  Filled 2020-07-04 (×2): qty 1

## 2020-07-04 MED ORDER — TICAGRELOR 90 MG PO TABS
ORAL_TABLET | ORAL | Status: DC | PRN
  Administered 2020-07-04: 180 mg via ORAL

## 2020-07-04 MED ORDER — SODIUM CHLORIDE 0.9% FLUSH
3.0000 mL | Freq: Two times a day (BID) | INTRAVENOUS | Status: DC
  Administered 2020-07-04 – 2020-07-05 (×2): 3 mL via INTRAVENOUS

## 2020-07-04 MED ORDER — VERAPAMIL HCL 2.5 MG/ML IV SOLN
INTRAVENOUS | Status: DC | PRN
  Administered 2020-07-04: 10 mL via INTRA_ARTERIAL

## 2020-07-04 MED ORDER — NOREPINEPHRINE 4 MG/250ML-% IV SOLN: 0.0000 ug/min | INTRAVENOUS | Status: DC

## 2020-07-04 MED ORDER — ASPIRIN 81 MG PO CHEW
81.0000 mg | CHEWABLE_TABLET | Freq: Every day | ORAL | Status: DC
  Administered 2020-07-04 – 2020-07-05 (×2): 81 mg via ORAL
  Filled 2020-07-04 (×2): qty 1

## 2020-07-04 MED ORDER — SODIUM CHLORIDE 0.9 % IV SOLN: INTRAVENOUS | Status: AC

## 2020-07-04 MED ORDER — ACETAMINOPHEN 325 MG PO TABS
650.0000 mg | ORAL_TABLET | ORAL | Status: DC | PRN
  Administered 2020-07-04: 650 mg via ORAL
  Filled 2020-07-04 (×2): qty 2

## 2020-07-04 MED ORDER — ASPIRIN 81 MG PO CHEW
324.0000 mg | CHEWABLE_TABLET | Freq: Once | ORAL | Status: AC
  Administered 2020-07-04: 324 mg via ORAL
  Filled 2020-07-04: qty 4

## 2020-07-04 MED ORDER — HEPARIN SODIUM (PORCINE) 5000 UNIT/ML IJ SOLN
4000.0000 [IU] | Freq: Once | INTRAMUSCULAR | Status: AC
  Administered 2020-07-04: 4000 [IU] via INTRAVENOUS
  Filled 2020-07-04: qty 1

## 2020-07-04 MED ORDER — HYDRALAZINE HCL 20 MG/ML IJ SOLN: 10.0000 mg | INTRAMUSCULAR | Status: AC | PRN

## 2020-07-04 MED ORDER — MIDAZOLAM HCL 2 MG/2ML IJ SOLN
INTRAMUSCULAR | Status: DC | PRN
  Administered 2020-07-04: 2 mg via INTRAVENOUS

## 2020-07-04 MED ORDER — IOHEXOL 350 MG/ML SOLN
INTRAVENOUS | Status: DC | PRN
  Administered 2020-07-04: 125 mL

## 2020-07-04 MED ORDER — FENTANYL CITRATE (PF) 100 MCG/2ML IJ SOLN
INTRAMUSCULAR | Status: DC | PRN
  Administered 2020-07-04: 50 ug via INTRAVENOUS

## 2020-07-04 MED ORDER — NOREPINEPHRINE BITARTRATE 1 MG/ML IV SOLN
INTRAVENOUS | Status: AC | PRN
  Administered 2020-07-04: 10 ug/min via INTRAVENOUS

## 2020-07-04 MED ORDER — DIAZEPAM 5 MG PO TABS: 5.0000 mg | ORAL_TABLET | ORAL | Status: DC | PRN

## 2020-07-04 MED ORDER — LIDOCAINE HCL (PF) 1 % IJ SOLN
INTRAMUSCULAR | Status: DC | PRN
  Administered 2020-07-04: 2 mL

## 2020-07-04 MED ORDER — TICAGRELOR 90 MG PO TABS
90.0000 mg | ORAL_TABLET | Freq: Two times a day (BID) | ORAL | Status: DC
  Administered 2020-07-04 – 2020-07-05 (×3): 90 mg via ORAL
  Filled 2020-07-04 (×3): qty 1

## 2020-07-04 MED ORDER — AMIODARONE HCL 150 MG/3ML IV SOLN
INTRAVENOUS | Status: DC | PRN
  Administered 2020-07-04: 150 mg via INTRAVENOUS

## 2020-07-04 MED ORDER — NITROGLYCERIN 1 MG/10 ML FOR IR/CATH LAB
INTRA_ARTERIAL | Status: DC | PRN
  Administered 2020-07-04: 200 ug via INTRACORONARY

## 2020-07-04 MED ORDER — SODIUM CHLORIDE 0.9 % IV SOLN
INTRAVENOUS | Status: AC | PRN
  Administered 2020-07-04: 75 mL/h via INTRAVENOUS

## 2020-07-04 MED ORDER — CHLORHEXIDINE GLUCONATE CLOTH 2 % EX PADS
6.0000 | MEDICATED_PAD | Freq: Every day | CUTANEOUS | Status: DC
  Administered 2020-07-04 – 2020-07-05 (×2): 6 via TOPICAL

## 2020-07-04 MED ORDER — METOPROLOL TARTRATE 5 MG/5ML IV SOLN
INTRAVENOUS | Status: AC
  Filled 2020-07-04: qty 5

## 2020-07-04 MED ORDER — SODIUM CHLORIDE 0.9% FLUSH: 3.0000 mL | INTRAVENOUS | Status: DC | PRN

## 2020-07-04 MED ORDER — ASPIRIN 81 MG PO CHEW
CHEWABLE_TABLET | ORAL | Status: AC
  Filled 2020-07-04: qty 4

## 2020-07-04 MED ORDER — SODIUM CHLORIDE 0.9 % IV SOLN: 250.0000 mL | INTRAVENOUS | Status: DC | PRN

## 2020-07-04 MED ORDER — NITROGLYCERIN 0.4 MG SL SUBL
SUBLINGUAL_TABLET | SUBLINGUAL | Status: AC
  Administered 2020-07-04: 0.4 mg
  Filled 2020-07-04: qty 1

## 2020-07-04 MED ORDER — LABETALOL HCL 5 MG/ML IV SOLN: 10.0000 mg | INTRAVENOUS | Status: AC | PRN

## 2020-07-04 SURGICAL SUPPLY — 16 items
BALLN SAPPHIRE 2.5X12 (BALLOONS) ×2
BALLN SAPPHIRE ~~LOC~~ 3.5X18 (BALLOONS) ×1 IMPLANT
BALLOON SAPPHIRE 2.5X12 (BALLOONS) IMPLANT
CATH INFINITI 5 FR JL3.5 (CATHETERS) ×1 IMPLANT
CATH OPTITORQUE TIG 4.0 5F (CATHETERS) ×1 IMPLANT
CATH VISTA GUIDE 6FR JR4 (CATHETERS) ×1 IMPLANT
GLIDESHEATH SLEND SS 6F .021 (SHEATH) ×1 IMPLANT
GUIDEWIRE INQWIRE 1.5J.035X260 (WIRE) IMPLANT
INQWIRE 1.5J .035X260CM (WIRE) ×2
KIT ENCORE 26 ADVANTAGE (KITS) ×1 IMPLANT
KIT HEART LEFT (KITS) ×2 IMPLANT
PACK CARDIAC CATHETERIZATION (CUSTOM PROCEDURE TRAY) ×2 IMPLANT
STENT RESOLUTE ONYX 3.5X30 (Permanent Stent) ×1 IMPLANT
TRANSDUCER W/STOPCOCK (MISCELLANEOUS) ×2 IMPLANT
TUBING CIL FLEX 10 FLL-RA (TUBING) ×2 IMPLANT
WIRE COUGAR XT STRL 190CM (WIRE) ×1 IMPLANT

## 2020-07-04 NOTE — H&P (Signed)
Cardiology History & Physical    Patient ID: Matthew Patel MRN: [id number], DOB/AGE: [birth year]   Admit date: 07/04/2020  Primary Physician: Abigail Miyamoto, MD Primary Cardiologist: No primary care provider on file.  Patient Profile    Matthew Patel is a 54 year old male with a history of hyperlipidemia, GERD, smoking (current), and COPD who presents with chest pain and found to have inferoposterior STEMI.  History of Present Illness    Matthew Patel was in his usual state of health with no prior cardiac history or history of chest pain, when he noted sudden onset of substernal chest pain around 11:30 pm tonight. He describes chest pressure with radiation to both arms and nausea. Initially presented to [facility] ED in stable condition. ECG showed ST elevation in inferior leads, septal ST depression consistent with posterior injury pattern, more mild ST elevation in V5/V6, and reciprocal changes. He was given full dose aspirin and heparin 4000 units and emergently transferred directly to [facility] cath lab where he was found to have a proximally occluded RCA. Upon initial angioplasty, he developed reperfusion-associated hypotension and AIVR treated with fluid resuscitation and Levophed. He was also given amiodarone. He subsequently stabilized with these interventions and underwent successful PCI of the proximal to mid RCA, likely with some distal embolization into the distal PDA and was started on Aggrastat. Loaded with ticagrelor. Chest-pain free post intervention.  Past Medical History   Past Medical History:  Diagnosis Date  . GERD (gastroesophageal reflux disease)   . Migraines   . Mixed hyperlipidemia     History reviewed. No pertinent surgical history.   Allergies Allergies  Allergen Reactions  . Ranitidine Swelling    Home Medications    Prior to Admission medications   Medication Sig Start Date End Date Taking? Authorizing Provider  COSENTYX SENSOREADY, 300 MG, 150  MG/ML SOAJ Inject 2 Syringes into the skin every 28 (twenty-eight) days. 05/13/20   [provider]  omeprazole (PRILOSEC) 40 MG capsule TAKE 1 CAPSULE (40 MG TOTAL) BY MOUTH 2 (TWO) TIMES DAILY BEFORE A MEAL. 01/08/20   Abigail Miyamoto, MD  Secukinumab (COSENTYX, 300 MG DOSE, [place]) Inject 300 mg into the skin once a week.    [provider]    Family History    Family History  Family history unknown: Yes   has no family status information on file.    Social History    Social History   Socioeconomic History  . Marital status: Single    Spouse name: Not on file  . Number of children: Not on file  . Years of education: Not on file  . Highest education level: Not on file  Occupational History  . Not on file  Tobacco Use  . Smoking status: Current Every Day Smoker    Packs/day: 1.50    Types: Cigarettes  . Smokeless tobacco: Never Used  Vaping Use  . Vaping Use: Never used  Substance and Sexual Activity  . Alcohol use: Never  . Drug use: Never  . Sexual activity: Not on file  Other Topics Concern  . Not on file  Social History Narrative  . Not on file   Social Determinants of Health   Financial Resource Strain:   . Difficulty of Paying Living Expenses: Not on file  Food Insecurity:   . Worried About Programme researcher, broadcasting/film/video in the Last Year: Not on file  . Ran Out of Food in the Last Year: Not on  file  Transportation Needs:   . Freight forwarder (Medical): Not on file  . Lack of Transportation (Non-Medical): Not on file  Physical Activity:   . Days of Exercise per Week: Not on file  . Minutes of Exercise per Session: Not on file  Stress:   . Feeling of Stress : Not on file  Social Connections:   . Frequency of Communication with Friends and Family: Not on file  . Frequency of Social Gatherings with Friends and Family: Not on file  . Attends Religious Services: Not on file  . Active Member of Clubs or Organizations: Not on file  . Attends  Banker Meetings: Not on file  . Marital Status: Not on file  Intimate Partner Violence:   . Fear of Current or Ex-Partner: Not on file  . Emotionally Abused: Not on file  . Physically Abused: Not on file  . Sexually Abused: Not on file     Review of Systems    A comprehensive review of systems was performed with pertinent positives and negatives noted in the HPI.  Physical Exam    BP (!) 141/87   Pulse 74   Temp 98.4 F (36.9 C) (Other (Comment))   Resp (!) 29   Ht 5\' 10"  (1.778 m)   Wt 70.4 kg   SpO2 99%   BMI 22.27 kg/m  General: Alert, NAD HEENT: Normal  Neck: No bruits or JVD. Lungs:  Resp regular and unlabored, CTA bilaterally. Heart: Regular rhythm, no s3, s4, or murmurs. Abdomen: Soft, non-tender, non-distended, BS +.  Extremities: Warm. No clubbing, cyanosis or edema. DP/PT/Radials 2+ and equal bilaterally. Psych: Normal affect. Neuro: Alert and oriented. No gross focal deficits. No abnormal movements.  Labs    Cardiac Panel (last 3 results) Recent Labs    07/04/20 0155  TROPONINIHS 268*    Lab Results  Component Value Date   WBC 14.0 (H) 07/04/2020   HGB 13.7 07/04/2020   HCT 37.8 (L) 07/04/2020   MCV 92.0 07/04/2020   PLT 308 07/04/2020    Recent Labs  Lab 07/04/20 0200  NA 140  K 3.4*  CL 102  CO2 29  BUN 14  CREATININE 0.70  CALCIUM 10.0  PROT 8.1  BILITOT 0.5  ALKPHOS 55  ALT 18  AST 15  GLUCOSE 119*   Lab Results  Component Value Date   CHOL 178 07/04/2020   HDL 31 (L) 07/04/2020   LDLCALC 112 (H) 07/04/2020   TRIG 174 (H) 07/04/2020   No results found for: [facility]   Radiology Studies    DG Chest 1 View  Result Date: 07/04/2020 CLINICAL DATA:  Code STEMI EXAM: CHEST  1 VIEW COMPARISON:  October 08, 2019 FINDINGS: The heart size and mediastinal contours are within normal limits. Both lungs are clear. The visualized skeletal structures are unremarkable. IMPRESSION: No active disease. Electronically Signed   By:  October 10, 2019 M.D.   On: 07/04/2020 02:18   CARDIAC CATHETERIZATION  Result Date: 07/04/2020  Prox RCA to Mid RCA lesion is 100% stenosed.  RPDA lesion is 100% stenosed.  Ost LAD to Prox LAD lesion is 20% stenosed.  Prox LAD to Mid LAD lesion is 20% stenosed.  Acute inferior ST segment elevation myocardial infarction secondary to thrombotic occlusion of the proximal to mid RCA with initial TIMI 0 flow. Mild 20% ostial narrowing of the LAD and 20% mid LAD stenosis. Normal left circumflex coronary artery. Reperfusion mediated hypotension, idioventricular rhythm, requiring fluid resuscitation,  IV amiodarone in addition to Levophed. Aggrastat administration with thrombotic occlusion and probable embolized thrombus to the very distal PDA. Successful percutaneous coronary intervention to the RCA with ultimate insertion of a 3.5 x 30 mm Resolute Onyx DES stent postdilated to 3.55 mm with the 100 % occlusion being reduced to 0% and restoration of brisk TIMI-3 flow. RECOMMENDATION: DAPT for minimum of 1 year.  2D echo Doppler study will be done in a.m. to assess RV and LV function.  Continued fluid resuscitation with weaning and discontinuance of Levophed as blood pressure allows.  Aggressive lipid-lowering therapy with target LDL less than 70.  Once Levophed is considered, initiate post MI beta-blocker and ACE/ARB.  Smoking cessation is essential.    ECG & Cardiac Imaging    ECG: Sinus rhythm, ST elevation in inferior leads, septal ST depression consistent with posterior injury pattern, more mild ST elevation in V5/V6, and reciprocal changes. - personally reviewed.  Assessment & Plan    Inferoposterior STEMI: presented with 3.5 hours of ongoing chest pain, inferior/posterior ST elevation on ECG, 100% proximally occluded RCA. Successfully PCI of a large prox-mid RCA with some degree of distal embolization into the distal PDA. AIVR and shock initially with reperfusion but stabilized with fluids and  Levophed. LVEDP 11. He is now chest pain free. No significant residual disease. He has some family history, though has an extensive smoking history. Smoking cessation critical in addition to aggressive secondary prevention. The patient's TIMI risk score is 3, which indicates a 4.4% risk of all cause mortality at 30 days.   - Weaning Levophed. - Continue aspirin and ticagrelor for at least 1 year.  - Continue Aggrastat post-PCI per Dr. Tresa Endo - Start atorvastatin 80mg   - Once more stable and off vasopressor, can consider initiation of further GDMT in the morning - TTE in the morning - Check lipid profile and A1c - Monitor on telemetry and trend troponin - Smoking cessation as noted below  Tobacco use:  - Nicotine patch - Smoking cessation education and consult  COPD: currently stable - Does not appear to be on any controller medication - smoking cessation as above - Nebs prn   Psoriasis: on weekly Cosentyx, last dose was yesterday.  Nutrition: Heart healthy diet DVT ppx: on tirofiban Advanced Care Planning: Full Code   Signed, , MD 07/04/2020, 6:23 AM

## 2020-07-04 NOTE — ED Provider Notes (Signed)
[facility]-EMERGENCY DEPT Provider Note   CSN: [id number] Arrival date & time: 07/04/20  0147     History No chief complaint on file.   Matthew Patel is a 54 y.o. male.  HPI     This is a 54 year old male with a history of smoking and mixed hyperlipidemia who presents with chest pain.  Onset of chest pain around 11:30 PM.  He reports anterior pressure-like pain that radiates to both arms.  He also reports nausea.  No known history of coronary artery disease.  Reports early family history of heart disease.  No obvious exertional component but he is not really exerted himself.  He is not taking anything for symptoms.  Currently his pain is 5 out of 10.  Level 5 caveat for acuity of condition  Past Medical History:  Diagnosis Date  . GERD (gastroesophageal reflux disease)   . Migraines   . Mixed hyperlipidemia     Patient Active Problem List   Diagnosis Date Noted  . Plantar fasciitis of left foot 02/08/2020  . Tendonitis, Achilles, left 02/08/2020  . Tarsal tunnel syndrome of left side 02/08/2020  . GERD (gastroesophageal reflux disease) 11/12/2019  . Obstructive chronic bronchitis without exacerbation (HCC) 11/12/2019  . Mixed hyperlipidemia   . Tension type headache 04/04/2015    History reviewed. No pertinent surgical history.     Family History  Family history unknown: Yes    Social History   Tobacco Use  . Smoking status: Current Every Day Smoker    Packs/day: 1.50    Types: Cigarettes  . Smokeless tobacco: Never Used  Vaping Use  . Vaping Use: Never used  Substance Use Topics  . Alcohol use: Never  . Drug use: Never    Home Medications Prior to Admission medications   Medication Sig Start Date End Date Taking? Authorizing Provider  COSENTYX SENSOREADY, 300 MG, 150 MG/ML SOAJ Inject 2 Syringes into the skin every 28 (twenty-eight) days. 05/13/20   [provider]  omeprazole (PRILOSEC) 40 MG capsule TAKE 1 CAPSULE  (40 MG TOTAL) BY MOUTH 2 (TWO) TIMES DAILY BEFORE A MEAL. 01/08/20   Abigail Miyamoto, MD  Secukinumab (COSENTYX, 300 MG DOSE, [place]) Inject 300 mg into the skin once a week.    [provider]    Allergies    Ranitidine  Review of Systems   Review of Systems  Constitutional: Negative for fever.  Respiratory: Negative for shortness of breath.   Cardiovascular: Positive for chest pain.  Gastrointestinal: Positive for nausea. Negative for abdominal pain and vomiting.  Neurological: Positive for light-headedness.  All other systems reviewed and are negative.   Physical Exam Updated Vital Signs BP (!) 138/102 (BP Location: Left Arm)   Pulse 77   Temp 98 F (36.7 C) (Oral)   Resp (!) 22   Ht 1.778 m (5\' 10" )   Wt 66.7 kg   SpO2 99%   BMI 21.09 kg/m   Physical Exam Vitals and nursing note reviewed.  Constitutional:      Appearance: He is well-developed. He is not ill-appearing.  HENT:     Head: Normocephalic and atraumatic.     Nose: Nose normal.     Mouth/Throat:     Mouth: Mucous membranes are moist.  Eyes:     Pupils: Pupils are equal, round, and reactive to light.  Cardiovascular:     Rate and Rhythm: Normal rate and regular rhythm.     Heart sounds: Normal heart sounds.  No murmur heard.   Pulmonary:     Effort: Pulmonary effort is normal. No respiratory distress.     Breath sounds: Wheezing present.     Comments: Occasional wheeze Abdominal:     General: Bowel sounds are normal.     Palpations: Abdomen is soft.     Tenderness: There is no abdominal tenderness. There is no rebound.  Musculoskeletal:     Cervical back: Neck supple.     Right lower leg: No edema.     Left lower leg: No edema.  Lymphadenopathy:     Cervical: No cervical adenopathy.  Skin:    General: Skin is warm and dry.  Neurological:     Mental Status: He is alert and oriented to person, place, and time.  Psychiatric:        Mood and Affect: Mood normal.     ED Results /  Procedures / Treatments   Labs (all labs ordered are listed, but only abnormal results are displayed) Labs Reviewed  CBC WITH DIFFERENTIAL/PLATELET - Abnormal; Notable for the following components:      Result Value   WBC 15.5 (*)    Neutro Abs 9.8 (*)    Monocytes Absolute 1.1 (*)    All other components within normal limits  RESP PANEL BY RT-PCR (FLU A&B, COVID) ARPGX2  HEMOGLOBIN A1C  PROTIME-INR  APTT  COMPREHENSIVE METABOLIC PANEL  LIPID PANEL  TROPONIN I (HIGH SENSITIVITY)    EKG EKG Interpretation  Date/Time:  Tuesday July 04 2020 01:54:52 EST Ventricular Rate:  84 PR Interval:    QRS Duration: 114 QT Interval:  362 QTC Calculation: 428 R Axis:   78 Text Interpretation: Sinus rhythm Inferoposterior infarct, acute (LCx) Lateral leads are also involved ST depression V1-V3, suggest recording posterior leads 12 Lead; Mason-Likar >>> Acute MI <<< ** ** ACUTE MI / STEMI ** ** Confirmed by Ross Marcus ([id number]) on 07/04/2020 2:12:12 AM   Radiology No results found.  Procedures Procedures (including critical care time)  CRITICAL CARE Performed by: Shon Baton   Total critical care time: 31 minutes  Critical care time was exclusive of separately billable procedures and treating other patients.  Critical care was necessary to treat or prevent imminent or life-threatening deterioration.  Critical care was time spent personally by me on the following activities: development of treatment plan with patient and/or surrogate as well as nursing, discussions with consultants, evaluation of patient's response to treatment, examination of patient, obtaining history from patient or surrogate, ordering and performing treatments and interventions, ordering and review of laboratory studies, ordering and review of radiographic studies, pulse oximetry and re-evaluation of patient's condition.   Medications Ordered in ED Medications  0.9 %  sodium chloride infusion (has  no administration in time range)  metoprolol tartrate (LOPRESSOR) 5 MG/5ML injection (has no administration in time range)  aspirin 81 MG chewable tablet (has no administration in time range)  morphine 2 MG/ML injection (has no administration in time range)  nitroGLYCERIN (NITROSTAT) 0.4 MG SL tablet (has no administration in time range)  nitroGLYCERIN (NITROSTAT) 0.4 MG SL tablet (has no administration in time range)  aspirin chewable tablet 324 mg (324 mg Oral Given 07/04/20 0210)  heparin injection 4,000 Units (4,000 Units Intravenous Given 07/04/20 0216)  nitroGLYCERIN (NITROSTAT) 0.4 MG SL tablet (0.4 mg  Given 07/04/20 3614)  morphine 2 MG/ML injection 2 mg (2 mg Intravenous Given 07/04/20 4315)    ED Course  I have reviewed the triage vital signs and  the nursing notes.  Pertinent labs & imaging results that were available during my care of the patient were reviewed by me and considered in my medical decision making (see chart for details).  Clinical Course as of Jul 04 216  Tue Jul 04, 2020  0155 STEMI activated   [CH]    Clinical Course User Index [CH] Kaetlyn Noa, Mayer Masker, MD   MDM Rules/Calculators/A&P                          Patient presents with chest pain.  EKG obtained and shows classic inferior MI with anterior reciprocal changes.  Code STEMI was activated.  Patient does have risk factors including smoking and early family history.  Also chart review reveals mixed hyperlipidemia.  Spoke with Dr. Tresa Endo.  Patient will be emergently transferred to [facility], ED.  Lab work obtained per order set.  Patient was given heparin bolus, nitro, and morphine for pain.  On recheck prior to transfer, pain now 2 out of 10.   Final Clinical Impression(s) / ED Diagnoses Final diagnoses:  ST elevation myocardial infarction (STEMI), unspecified artery [facility])    Rx / DC Orders ED Discharge Orders    None       Shali Vesey, Mayer Masker, MD 07/04/20 [phone]

## 2020-07-04 NOTE — Plan of Care (Signed)

## 2020-07-04 NOTE — ED Notes (Signed)
[facility] Charge nurse notified of STEMI

## 2020-07-04 NOTE — ED Triage Notes (Addendum)
Patient arrived with complaints of generalized chest pain that radiates down both arms, states it started around 1230am. Also reporting nausea and feeling lightheaded.

## 2020-07-04 NOTE — Plan of Care (Signed)
  Problem: Education: Goal: Understanding of CV disease, CV risk reduction, and recovery process will improve Outcome: Progressing Goal: Individualized Educational Video(s) Outcome: Progressing   Problem: Activity: Goal: Ability to return to baseline activity level will improve Outcome: Progressing   Problem: Cardiovascular: Goal: Ability to achieve and maintain adequate cardiovascular perfusion will improve Outcome: Progressing   

## 2020-07-04 NOTE — Progress Notes (Signed)
  Echocardiogram 2D Echocardiogram has been performed.  Matthew Patel 07/04/2020, 3:14 PM

## 2020-07-04 NOTE — Progress Notes (Signed)
Progress Note  Patient Name: Matthew Patel Date of Encounter: 07/04/2020  [facility] Cardiologist: No primary care provider on file.    Subjective    54 yo with hx of HGLD, smoker, COPD presented last night with inferior wall SETMI Had successful stenting of the RCA,  Had aggrastat following procedure for probably embolization of the thrombus to the distal RCA  He is ordered to receive aggrastat for 18 hours. Has developed a right wrist hematoma.    April, RN has marked the hematoma with a sharpie pen.  No further cp    Inpatient Medications    Scheduled Meds: . aspirin      . aspirin  81 mg Oral Daily  . atorvastatin  80 mg Oral Daily  . metoprolol tartrate      . morphine      . nitroGLYCERIN      . nitroGLYCERIN      . sodium chloride flush  3 mL Intravenous Q12H  . ticagrelor  90 mg Oral BID   Continuous Infusions: . sodium chloride    . sodium chloride    . sodium chloride 150 mL/hr at 07/04/20 0626   Followed by  . sodium chloride    . norepinephrine (LEVOPHED) Adult infusion 10 mcg/min (07/04/20 0420)  . tirofiban 0.15 mcg/kg/min (07/04/20 0626)   PRN Meds: sodium chloride, acetaminophen, diazepam, hydrALAZINE, labetalol, ondansetron (ZOFRAN) IV, sodium chloride flush   Vital Signs    Vitals:   07/04/20 0630 07/04/20 0645 07/04/20 0700 07/04/20 0715  BP: 120/86 114/83 106/83 109/83  Pulse: 82 83 80 81  Resp: (!) 31 (!) 25 (!) 27 (!) 31  Temp:      TempSrc:      SpO2: 99% 97% 97% 98%  Weight:      Height:        Intake/Output Summary (Last 24 hours) at 07/04/2020 0753 Last data filed at 07/04/2020 0600 Gross per 24 hour  Intake 346.62 ml  Output 300 ml  Net 46.62 ml   Last 3 Weights 07/04/2020 07/04/2020 02/08/2020  Weight (lbs) 155 lb 3.3 oz 147 lb 150 lb 3.2 oz  Weight (kg) 70.4 kg 66.679 kg 68.13 kg      Telemetry    NSR  - Personally Reviewed  ECG     - Personally Reviewed  Physical Exam    GEN: No acute distress.  Middle  age male,   Neck: No JVD Cardiac: RRR, no murmurs, rubs, or gallops.  Respiratory: Clear to auscultation bilaterally. GI: Soft, nontender, non-distended  MS: moderate sized right wrist hematoma.   TR band still in place.   The hematoma size is generally decreasing according to patient  Neuro:  Nonfocal  Psych: Normal affect   Labs    High Sensitivity Troponin:   Recent Labs  Lab 07/04/20 0155 07/04/20 0515  TROPONINIHS 268* >27,000*      Chemistry Recent Labs  Lab 07/04/20 0200 07/04/20 0515  NA 140 139  K 3.4* 3.7  CL 102 105  CO2 29 22  GLUCOSE 119* 167*  BUN 14 12  CREATININE 0.70 0.81  CALCIUM 10.0 9.4  PROT 8.1  --   ALBUMIN 4.8  --   AST 15  --   ALT 18  --   ALKPHOS 55  --   BILITOT 0.5  --   GFRNONAA >60 >60  ANIONGAP 9 12     Hematology Recent Labs  Lab 07/04/20 0200 07/04/20 0515  WBC 15.5*  14.0*  RBC 4.59 4.11*  HGB 14.8 13.7  HCT 43.6 37.8*  MCV 95.0 92.0  MCH 32.2 33.3  MCHC 33.9 36.2*  RDW 12.7 12.6  PLT 288 308    BNPNo results for input(s): BNP, PROBNP in the last 168 hours.   DDimer No results for input(s): DDIMER in the last 168 hours.   Radiology    DG Chest 1 View  Result Date: 07/04/2020 CLINICAL DATA:  Code STEMI EXAM: CHEST  1 VIEW COMPARISON:  October 08, 2019 FINDINGS: The heart size and mediastinal contours are within normal limits. Both lungs are clear. The visualized skeletal structures are unremarkable. IMPRESSION: No active disease. Electronically Signed   By: Katherine Mantle M.D.   On: 07/04/2020 02:18   CARDIAC CATHETERIZATION  Result Date: 07/04/2020  Prox RCA to Mid RCA lesion is 100% stenosed.  RPDA lesion is 100% stenosed.  Ost LAD to Prox LAD lesion is 20% stenosed.  Prox LAD to Mid LAD lesion is 20% stenosed.  Acute inferior ST segment elevation myocardial infarction secondary to thrombotic occlusion of the proximal to mid RCA with initial TIMI 0 flow. Mild 20% ostial narrowing of the LAD and 20% mid LAD  stenosis. Normal left circumflex coronary artery. Reperfusion mediated hypotension, idioventricular rhythm, requiring fluid resuscitation, IV amiodarone in addition to Levophed. Aggrastat administration with thrombotic occlusion and probable embolized thrombus to the very distal PDA. Successful percutaneous coronary intervention to the RCA with ultimate insertion of a 3.5 x 30 mm Resolute Onyx DES stent postdilated to 3.55 mm with the 100 % occlusion being reduced to 0% and restoration of brisk TIMI-3 flow. RECOMMENDATION: DAPT for minimum of 1 year.  2D echo Doppler study will be done in a.m. to assess RV and LV function.  Continued fluid resuscitation with weaning and discontinuance of Levophed as blood pressure allows.  Aggressive lipid-lowering therapy with target LDL less than 70.  Once Levophed is considered, initiate post MI beta-blocker and ACE/ARB.  Smoking cessation is essential.    Cardiac Studies      Patient Profile     54 y.o. male with inf. STEMI.  S/p stenting   Assessment & Plan    1/  Inferior STEMI:  S/p stenting of RCA.  Had some distal embolization of the thrombus down into the distal RCA and is getting Aggrastat for 18 hours. Has develped a moderate sized hematoma.    Will need to keep an eye on this hematoma.  Cont asa, brilinta   2.   COPD:  Advised smoking cessation    For questions or updates, please contact [facility] Please consult [email or web address] for contact info under        Signed, Kristeen Miss, MD  07/04/2020, 7:53 AM

## 2020-07-05 ENCOUNTER — Encounter (HOSPITAL_COMMUNITY): Payer: Self-pay | Admitting: Cardiovascular Disease

## 2020-07-05 DIAGNOSIS — Z95820 Peripheral vascular angioplasty status with implants and grafts: Secondary | ICD-10-CM

## 2020-07-05 DIAGNOSIS — Z720 Tobacco use: Secondary | ICD-10-CM | POA: Diagnosis present

## 2020-07-05 DIAGNOSIS — I2510 Atherosclerotic heart disease of native coronary artery without angina pectoris: Secondary | ICD-10-CM | POA: Insufficient documentation

## 2020-07-05 DIAGNOSIS — E782 Mixed hyperlipidemia: Secondary | ICD-10-CM

## 2020-07-05 DIAGNOSIS — I25118 Atherosclerotic heart disease of native coronary artery with other forms of angina pectoris: Secondary | ICD-10-CM | POA: Diagnosis present

## 2020-07-05 DIAGNOSIS — I255 Ischemic cardiomyopathy: Secondary | ICD-10-CM

## 2020-07-05 HISTORY — DX: Tobacco use: Z72.0

## 2020-07-05 HISTORY — DX: Peripheral vascular angioplasty status with implants and grafts: Z95.820

## 2020-07-05 HISTORY — DX: Atherosclerotic heart disease of native coronary artery without angina pectoris: I25.10

## 2020-07-05 HISTORY — DX: Ischemic cardiomyopathy: I25.5

## 2020-07-05 LAB — BASIC METABOLIC PANEL
Anion gap: 9 (ref 5–15)
BUN: 8 mg/dL (ref 6–20)
CO2: 22 mmol/L (ref 22–32)
Calcium: 8.8 mg/dL — ABNORMAL LOW (ref 8.9–10.3)
Chloride: 110 mmol/L (ref 98–111)
Creatinine, Ser: 0.69 mg/dL (ref 0.61–1.24)
GFR, Estimated: >60 mL/min (ref >60)
Glucose, Bld: 102 mg/dL — ABNORMAL HIGH (ref 70–99)
Potassium: 3.4 mmol/L — ABNORMAL LOW (ref 3.5–5.1)
Sodium: 141 mmol/L (ref 135–145)

## 2020-07-05 LAB — CBC
HCT: 37.8 % — ABNORMAL LOW (ref 39.0–52.0)
Hemoglobin: 12.9 g/dL — ABNORMAL LOW (ref 13.0–17.0)
MCH: 32.1 pg (ref 26.0–34.0)
MCHC: 34.1 g/dL (ref 30.0–36.0)
MCV: 94 fL (ref 80.0–100.0)
Platelets: 245 10*3/uL (ref 150–400)
RBC: 4.02 MIL/uL — ABNORMAL LOW (ref 4.22–5.81)
RDW: 12.8 % (ref 11.5–15.5)
WBC: 11.5 10*3/uL — ABNORMAL HIGH (ref 4.0–10.5)
nRBC: 0 % (ref 0.0–0.2)

## 2020-07-05 MED ORDER — NITROGLYCERIN 0.4 MG SL SUBL: 0.4000 mg | SUBLINGUAL_TABLET | SUBLINGUAL | 4 refills | Status: DC | PRN

## 2020-07-05 MED ORDER — ASPIRIN 81 MG PO CHEW: 81.0000 mg | CHEWABLE_TABLET | Freq: Every day | ORAL | Status: AC

## 2020-07-05 MED ORDER — ACETAMINOPHEN 325 MG PO TABS: 650.0000 mg | ORAL_TABLET | ORAL | Status: DC | PRN

## 2020-07-05 MED ORDER — CARVEDILOL 3.125 MG PO TABS: 3.1250 mg | ORAL_TABLET | Freq: Two times a day (BID) | ORAL | 6 refills | Status: DC

## 2020-07-05 MED ORDER — LOSARTAN POTASSIUM 25 MG PO TABS: 25.0000 mg | ORAL_TABLET | Freq: Every day | ORAL | 6 refills | Status: DC

## 2020-07-05 MED ORDER — LOSARTAN POTASSIUM 25 MG PO TABS
25.0000 mg | ORAL_TABLET | Freq: Every day | ORAL | Status: DC
  Administered 2020-07-05: 25 mg via ORAL
  Filled 2020-07-05: qty 1

## 2020-07-05 MED ORDER — TICAGRELOR 90 MG PO TABS: 90.0000 mg | ORAL_TABLET | Freq: Two times a day (BID) | ORAL | 11 refills | Status: DC

## 2020-07-05 MED ORDER — POTASSIUM CHLORIDE CRYS ER 20 MEQ PO TBCR
20.0000 meq | EXTENDED_RELEASE_TABLET | Freq: Once | ORAL | Status: AC
  Administered 2020-07-05: 20 meq via ORAL
  Filled 2020-07-05: qty 1

## 2020-07-05 MED ORDER — ATORVASTATIN CALCIUM 80 MG PO TABS: 80.0000 mg | ORAL_TABLET | Freq: Every day | ORAL | 6 refills | Status: DC

## 2020-07-05 MED ORDER — NITROGLYCERIN 0.4 MG SL SUBL: 0.4000 mg | SUBLINGUAL_TABLET | SUBLINGUAL | Status: DC | PRN

## 2020-07-05 MED ORDER — CARVEDILOL 3.125 MG PO TABS
3.1250 mg | ORAL_TABLET | Freq: Two times a day (BID) | ORAL | Status: DC
  Administered 2020-07-05: 3.125 mg via ORAL
  Filled 2020-07-05: qty 1

## 2020-07-05 NOTE — Discharge Instructions (Signed)
Call [facility] at [phone] if any bleeding, swelling or drainage at cath site.  May shower, no tub baths for 48 hours for groin sticks. No lifting over 5 pounds for 3 days.  No Driving for 3 days  Take 1 NTG, under your tongue, while sitting.  If no relief of pain may repeat NTG, one tab every 5 minutes up to 3 tablets total over 15 minutes.  If no relief CALL 911.  If you have dizziness/lightheadness  while taking NTG, stop taking and call 911.        Heart Healthy Diet    Do not stop Brilinta and asprin, they keep the stent open.  Stopping could cause a heart attack  Important to stop tobacco   Light duty at work only beginning 07/07/20 and to be determined by Dr. Dulce Sellar on visit but 2 weeks most likely

## 2020-07-05 NOTE — Progress Notes (Signed)
Progress Note  Patient Name: Matthew Patel Date of Encounter: 07/05/2020  [facility] Cardiologist: No primary care provider on file.    Subjective    54 yo with hx of HGLD, smoker, COPD presented last night with inferior wall SETMI Had successful stenting of the RCA,  Had aggrastat following procedure for probably embolization of the thrombus to the distal RCA  His right radial cath site hematoma has improved.  Appears stable.  His distal pulses are good.  He is not having any wrist pain.  I have advised him to take it easy for the next week or so.  He will ambulate today.  Assuming that he does well he will be ready for discharge.  Echocardiogram reveals mildly depressed left ventricular systolic function with an ejection fraction of around 45%.  Started on carvedilol 3.125 mg twice a day and losartan 25 mg a day.  He lives in [place] and would like to follow-up with Dr. Dulce Sellar.  Inpatient Medications    Scheduled Meds: . aspirin  81 mg Oral Daily  . atorvastatin  80 mg Oral Daily  . Chlorhexidine Gluconate Cloth  6 each Topical Daily  . pantoprazole  40 mg Oral BID AC  . sodium chloride flush  3 mL Intravenous Q12H  . ticagrelor  90 mg Oral BID   Continuous Infusions: . sodium chloride    . sodium chloride     PRN Meds: sodium chloride, acetaminophen, diazepam, ondansetron (ZOFRAN) IV, sodium chloride flush   Vital Signs    Vitals:   07/05/20 0500 07/05/20 0600 07/05/20 0700 07/05/20 0800  BP: 119/76 128/82 120/80 (!) 109/97  Pulse: (!) 58 79 86 73  Resp: 19 (!) 26 (!) 22 (!) 23  Temp:   (!) 97.1 F (36.2 C)   TempSrc:      SpO2: 96% 97% 98% 98%  Weight:      Height:        Intake/Output Summary (Last 24 hours) at 07/05/2020 0933 Last data filed at 07/04/2020 2300 Gross per 24 hour  Intake 984.64 ml  Output 2350 ml  Net -1365.36 ml   Last 3 Weights 07/04/2020 07/04/2020 02/08/2020  Weight (lbs) 155 lb 3.3 oz 147 lb 150 lb 3.2 oz  Weight (kg) 70.4 kg  66.679 kg 68.13 kg      Telemetry    NSR  - Personally Reviewed  ECG     - Personally Reviewed  Physical Exam    Physical Exam: Blood pressure 118/88, pulse 75, temperature (!) 97.1 F (36.2 C), resp. rate (!) 24, height 5\' 10"  (1.778 m), weight 70.4 kg, SpO2 100 %.  GEN:  Well nourished, well developed in no acute distress HEENT: Normal NECK: No JVD; No carotid bruits LYMPHATICS: No lymphadenopathy CARDIAC: RRR , no murmurs, rubs, gallops RESPIRATORY:  Clear to auscultation without rales, wheezing or rhonchi  ABDOMEN: Soft, non-tender, non-distended MUSCULOSKELETAL:   He has a moderate sized but soft hematoma associated with the right radial cath site.  Hematoma is stable. SKIN: Warm and dry NEUROLOGIC:  Alert and oriented x 3   Labs    High Sensitivity Troponin:   Recent Labs  Lab 07/04/20 0155 07/04/20 0515 07/04/20 0749  TROPONINIHS 268* >27,000* >27,000*      Chemistry Recent Labs  Lab 07/04/20 0200 07/04/20 0515 07/05/20 0028  NA 140 139 141  K 3.4* 3.7 3.4*  CL 102 105 110  CO2 29 22 22   GLUCOSE 119* 167* 102*  BUN 14  12 8  CREATININE 0.70 0.81 0.69  CALCIUM 10.0 9.4 8.8*  PROT 8.1  --   --   ALBUMIN 4.8  --   --   AST 15  --   --   ALT 18  --   --   ALKPHOS 55  --   --   BILITOT 0.5  --   --   GFRNONAA >60 >60 >60  ANIONGAP 9 12 9      Hematology Recent Labs  Lab 07/04/20 0200 07/04/20 0515 07/05/20 0028  WBC 15.5* 14.0* 11.5*  RBC 4.59 4.11* 4.02*  HGB 14.8 13.7 12.9*  HCT 43.6 37.8* 37.8*  MCV 95.0 92.0 94.0  MCH 32.2 33.3 32.1  MCHC 33.9 36.2* 34.1  RDW 12.7 12.6 12.8  PLT 288 308 245    BNPNo results for input(s): BNP, PROBNP in the last 168 hours.   DDimer No results for input(s): DDIMER in the last 168 hours.   Radiology    DG Chest 1 View  Result Date: 07/04/2020 CLINICAL DATA:  Code STEMI EXAM: CHEST  1 VIEW COMPARISON:  October 08, 2019 FINDINGS: The heart size and mediastinal contours are within normal limits.  Both lungs are clear. The visualized skeletal structures are unremarkable. IMPRESSION: No active disease. Electronically Signed   By: October 10, 2019 M.D.   On: 07/04/2020 02:18   CARDIAC CATHETERIZATION  Result Date: 07/04/2020  Prox RCA to Mid RCA lesion is 100% stenosed.  RPDA lesion is 100% stenosed.  Ost LAD to Prox LAD lesion is 20% stenosed.  Prox LAD to Mid LAD lesion is 20% stenosed.  Acute inferior ST segment elevation myocardial infarction secondary to thrombotic occlusion of the proximal to mid RCA with initial TIMI 0 flow. Mild 20% ostial narrowing of the LAD and 20% mid LAD stenosis. Normal left circumflex coronary artery. Reperfusion mediated hypotension, idioventricular rhythm, requiring fluid resuscitation, IV amiodarone in addition to Levophed. Aggrastat administration with thrombotic occlusion and probable embolized thrombus to the very distal PDA. Successful percutaneous coronary intervention to the RCA with ultimate insertion of a 3.5 x 30 mm Resolute Onyx DES stent postdilated to 3.55 mm with the 100 % occlusion being reduced to 0% and restoration of brisk TIMI-3 flow. RECOMMENDATION: DAPT for minimum of 1 year.  2D echo Doppler study will be done in a.m. to assess RV and LV function.  Continued fluid resuscitation with weaning and discontinuance of Levophed as blood pressure allows.  Aggressive lipid-lowering therapy with target LDL less than 70.  Once Levophed is considered, initiate post MI beta-blocker and ACE/ARB.  Smoking cessation is essential.   ECHOCARDIOGRAM COMPLETE  Result Date: 07/04/2020    ECHOCARDIOGRAM REPORT   Patient Name:   Matthew Patel Date of Exam: 07/04/2020 Medical Rec #:  14/01/2020       Height:       70.0 in Accession #:    [id number]      Weight:       155.2 lb Date of Birth:  06-Mar-1966       BSA:          1.874 m Patient Age:    54 years        BP:           109/83 mmHg Patient Gender: M               HR:           64 bpm. Exam Location:  Inpatient  Procedure: 2D Echo,  3D Echo, Cardiac Doppler and Color Doppler Indications:    121-121.4 ST elevation (STEMI) and non-ST elevation (NSTEMI)                 myocardial infarction  History:        Patient has no prior history of Echocardiogram examinations.                 Signs/Symptoms:Chest Pain; Risk Factors:Dyslipidemia.  Sonographer:    Sheralyn Boatman RDCS Referring Phys: [phone] THOMAS A KELLY IMPRESSIONS  1. Left ventricular ejection fraction, by estimation, is 40 to 45%. The left ventricle has mildly decreased function. The left ventricle demonstrates regional wall motion abnormalities (see scoring diagram/findings for description). There is mild concentric left ventricular hypertrophy. Left ventricular diastolic parameters were normal. There is mild hypokinesis of the left ventricular, basal-mid inferior wall. There is moderate hypokinesis of the left ventricular, basal-mid inferolateral wall.  2. Right ventricular systolic function is mildly reduced. The right ventricular size is mildly enlarged. Tricuspid regurgitation signal is inadequate for assessing PA pressure.  3. The mitral valve is normal in structure. No evidence of mitral valve regurgitation.  4. The aortic valve is tricuspid. Aortic valve regurgitation is not visualized. No aortic stenosis is present.  5. The inferior vena cava is dilated in size with >50% respiratory variability, suggesting right atrial pressure of 8 mmHg. FINDINGS  Left Ventricle: Left ventricular ejection fraction, by estimation, is 40 to 45%. The left ventricle has mildly decreased function. The left ventricle demonstrates regional wall motion abnormalities. Mild hypokinesis of the left ventricular, basal-mid inferior wall. Moderate hypokinesis of the left ventricular, basal-mid inferolateral wall. The left ventricular internal cavity size was normal in size. There is mild concentric left ventricular hypertrophy. Left ventricular diastolic parameters were normal. Normal left  ventricular filling pressure. Right Ventricle: The right ventricular size is mildly enlarged. No increase in right ventricular wall thickness. Right ventricular systolic function is mildly reduced. Tricuspid regurgitation signal is inadequate for assessing PA pressure. Left Atrium: Left atrial size was normal in size. Right Atrium: Right atrial size was normal in size. Pericardium: There is no evidence of pericardial effusion. Mitral Valve: The mitral valve is normal in structure. No evidence of mitral valve regurgitation. Tricuspid Valve: The tricuspid valve is normal in structure. Tricuspid valve regurgitation is not demonstrated. Aortic Valve: The aortic valve is tricuspid. Aortic valve regurgitation is not visualized. No aortic stenosis is present. Pulmonic Valve: The pulmonic valve was grossly normal. Pulmonic valve regurgitation is not visualized. Aorta: The aortic root and ascending aorta are structurally normal, with no evidence of dilitation. Venous: The inferior vena cava is dilated in size with greater than 50% respiratory variability, suggesting right atrial pressure of 8 mmHg. IAS/Shunts: No atrial level shunt detected by color flow Doppler.  LEFT VENTRICLE PLAX 2D LVIDd:         4.70 cm      Diastology LVIDs:         4.00 cm      LV e' medial:    8.05 cm/s LV PW:         1.30 cm      LV E/e' medial:  8.4 LV IVS:        1.10 cm      LV e' lateral:   12.00 cm/s LVOT diam:     2.00 cm      LV E/e' lateral: 5.7 LV SV:         63 LV SV Index:  33 LVOT Area:     3.14 cm  LV Volumes (MOD) LV vol d, MOD A2C: 72.8 ml LV vol d, MOD A4C: 109.0 ml LV vol s, MOD A2C: 43.9 ml LV vol s, MOD A4C: 57.4 ml LV SV MOD A2C:     28.9 ml LV SV MOD A4C:     109.0 ml LV SV MOD BP:      38.6 ml RIGHT VENTRICLE            IVC RV S prime:     9.68 cm/s  IVC diam: 2.30 cm TAPSE (M-mode): 1.9 cm LEFT ATRIUM             Index       RIGHT ATRIUM           Index LA diam:        2.50 cm 1.33 cm/m  RA Area:     11.30 cm LA Vol  (A2C):   21.9 ml 11.69 ml/m RA Volume:   27.00 ml  14.41 ml/m LA Vol (A4C):   30.0 ml 16.01 ml/m LA Biplane Vol: 27.8 ml 14.83 ml/m  AORTIC VALVE LVOT Vmax:   101.00 cm/s LVOT Vmean:  70.800 cm/s LVOT VTI:    0.199 m  AORTA Ao Root diam: 3.50 cm MITRAL VALVE MV Area (PHT): 2.51 cm    SHUNTS MV Decel Time: 302 msec    Systemic VTI:  0.20 m MV E velocity: 67.90 cm/s  Systemic Diam: 2.00 cm MV A velocity: 52.30 cm/s MV E/A ratio:  1.30 Mihai Croitoru MD Electronically signed by Thurmon Fair MD Signature Date/Time: 07/04/2020/4:11:34 PM    Final     Cardiac Studies      Patient Profile     54 y.o. male with inf. STEMI.  S/p stenting   Assessment & Plan    1/  Inferior STEMI:  S/p stenting of RCA.  Had some distal embolization of the thrombus down into the distal RCA and is getting Aggrastat for 18 hours.  His hematoma is stable.  Echocardiogram reveals mild LV dysfunction.  We will be starting carvedilol 3.125 mg twice a day and losartan 25 mg a day.  He lives in [place] and would like to follow-up with Dr. Dulce Sellar or one of his partners.   Cont asa, brilinta   2.   COPD:  Advised smoking cessation   4.  Hyperlipidemia: Lipid profile from yesterday reveals a total cholesterol of 178.  The HDL is 31.  LDL is 112.  Triglyceride level is 174.  He has been started on atorvastatin 80 mg a day.  I have advised him to work on reducing the amount of sugar and carbohydrates that he eats.  Of also advised him to work on cholesterol reduction.  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  For questions or updates, please contact [facility] Please consult [email or web address] for contact info under        Signed, Kristeen Miss, MD  07/05/2020, 9:33 AM

## 2020-07-05 NOTE — Progress Notes (Signed)
CARDIAC  REHAB  MODE:  Ambulation: 370 ft Up with  RN  POST:  Rate/Rhythm: 82 SR  BP:  Supine:   Sitting: 134/89  Standing   SaO2: 94%RA 1052-1135 Pt up walking with RN independently. No CP. Gait looks steady. Tolerated well. MI education completed with significant other and pt who voiced understanding. Stressed importance of brilinta with stent. Reviewed NTG use, MI restrictions, walking for ex, heart healthy food choices, smoking cessation and CRP 2. Gave smoking cessation handout and encouraged pt to consider a plan for quitting. Will refer to [place] CRP 2 as this is hospital pt prefers to attend.  Luetta Nutting, RN BSN  07/05/2020 11:33 AM

## 2020-07-05 NOTE — Plan of Care (Signed)

## 2020-07-05 NOTE — Discharge Summary (Addendum)
Discharge Summary    Patient ID: Matthew Patel MRN: [id number]; DOB: 12-01-1965  Admit date: 07/04/2020 Discharge date: 07/05/2020  Primary Care Provider: No primary care provider on file.  Primary Cardiologist: Norman Herrlich, MD  Primary Electrophysiologist:  None   Discharge Diagnoses    Principal Problem:   ST elevation myocardial infarction involving right coronary artery [facility]) Active Problems:   S/P angioplasty with stent RCA DES 07/04/20   Mixed hyperlipidemia   COPD (chronic obstructive pulmonary disease) (HCC)   Cardiomyopathy, ischemic   CAD in native artery   Tobacco abuse    Diagnostic Studies/Procedures    Cardiac cath 07/04/20 Prox RCA to Mid RCA lesion is 100% stenosed. RPDA lesion is 100% stenosed. Ost LAD to Prox LAD lesion is 20% stenosed. Prox LAD to Mid LAD lesion is 20% stenosed.   Acute inferior ST segment elevation myocardial infarction secondary to thrombotic occlusion of the proximal to mid RCA with initial TIMI 0 flow.   Mild 20% ostial narrowing of the LAD and 20% mid LAD stenosis.   Normal left circumflex coronary artery.   Reperfusion mediated hypotension, idioventricular rhythm, requiring fluid resuscitation, IV amiodarone in addition to Levophed.   Aggrastat administration with thrombotic occlusion and probable embolized thrombus to the very distal PDA.   Successful percutaneous coronary intervention to the RCA with ultimate insertion of a 3.5 x 30 mm Resolute Onyx DES stent postdilated to 3.55 mm with the 100 % occlusion being reduced to 0% and restoration of brisk TIMI-3 flow.   RECOMMENDATION: DAPT for minimum of 1 year.  2D echo Doppler study will be done in a.m. to assess RV and LV function.  Continued fluid resuscitation with weaning and discontinuance of Levophed as blood pressure allows.  Aggressive lipid-lowering therapy with target LDL less than 70.  Once Levophed is considered, initiate post MI beta-blocker and ACE/ARB.  Smoking  cessation is essential.   ECHO 07/04/20 IMPRESSIONS     1. Left ventricular ejection fraction, by estimation, is 40 to 45%. The  left ventricle has mildly decreased function. The left ventricle  demonstrates regional wall motion abnormalities (see scoring  diagram/findings for description). There is mild  concentric left ventricular hypertrophy. Left ventricular diastolic  parameters were normal. There is mild hypokinesis of the left ventricular,  basal-mid inferior wall. There is moderate hypokinesis of the left  ventricular, basal-mid inferolateral wall.   2. Right ventricular systolic function is mildly reduced. The right  ventricular size is mildly enlarged. Tricuspid regurgitation signal is  inadequate for assessing PA pressure.   3. The mitral valve is normal in structure. No evidence of mitral valve  regurgitation.   4. The aortic valve is tricuspid. Aortic valve regurgitation is not  visualized. No aortic stenosis is present.   5. The inferior vena cava is dilated in size with >50% respiratory  variability, suggesting right atrial pressure of 8 mmHg.   FINDINGS   Left Ventricle: Left ventricular ejection fraction, by estimation, is 40  to 45%. The left ventricle has mildly decreased function. The left  ventricle demonstrates regional wall motion abnormalities. Mild  hypokinesis of the left ventricular, basal-mid  inferior wall. Moderate hypokinesis of the left ventricular, basal-mid  inferolateral wall. The left ventricular internal cavity size was normal  in size. There is mild concentric left ventricular hypertrophy. Left  ventricular diastolic parameters were  normal. Normal left ventricular filling pressure.   Right Ventricle: The right ventricular size is mildly enlarged. No  increase in right ventricular wall  thickness. Right ventricular systolic  function is mildly reduced. Tricuspid regurgitation signal is inadequate  for assessing PA pressure.   Left Atrium:  Left atrial size was normal in size.   Right Atrium: Right atrial size was normal in size.   Pericardium: There is no evidence of pericardial effusion.   Mitral Valve: The mitral valve is normal in structure. No evidence of  mitral valve regurgitation.   Tricuspid Valve: The tricuspid valve is normal in structure. Tricuspid  valve regurgitation is not demonstrated.   Aortic Valve: The aortic valve is tricuspid. Aortic valve regurgitation is  not visualized. No aortic stenosis is present.   Pulmonic Valve: The pulmonic valve was grossly normal. Pulmonic valve  regurgitation is not visualized.   Aorta: The aortic root and ascending aorta are structurally normal, with  no evidence of dilitation.   Venous: The inferior vena cava is dilated in size with greater than 50%  respiratory variability, suggesting right atrial pressure of 8 mmHg.   IAS/Shunts: No atrial level shunt detected by color flow Doppler.      _____________   History of Present Illness     Matthew Patel is a 54 y.o. male with hx of HLD, GERD, tobacco use, COPD presented to ER 07/04/20 at [facility] with chest pain with radiation down both arms and with nausea, initially at 1130 pm on 07/03/20.  ECG showed ST elevation in inferior leads, septal ST depression consistent with posterior injury pattern, more mild ST elevation in V5/V6, and reciprocal changes. He was given full dose aspirin and heparin 4000 units.   He was transported to Cath lab at [facility] and underwent emergent cath.     Cath with proximally occluded RCA.  Upon initial angioplasty, he developed reperfusion-associated hypotension and AIVR treated with fluid resuscitation and Levophed. He was also given amiodarone. He subsequently stabilized with these interventions and underwent successful PCI of the proximal to mid RCA, likely with some distal embolization into the distal PDA and was started on Aggrastat. Loaded with ticagrelor. Chest-pain free post  intervention.  Hospital Course     Consultants: none  Pt's troponin >27,000 X 2  K+ 3.4 today, Cr 0.69   Hgb 12.9 WBC 11.5 plts 245.   EKG today SR at 74 and inf MI. With inf T wave inversions no further ST elevation.  BP stable.  Pt ambulated in hall without chest pain.  He has been started on BB, ARB, high dose statin and ASA and Brilinta. On echo his EF was down 40-45%. With meds as above should improve.    With hx of tobacco use and COPD he was advised to stop smoking.  [facility] has seen.     He was seen and evaluated by Dr. Elease Hashimoto and found stable for discharge.  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  Did the patient have an acute coronary syndrome (MI, NSTEMI, STEMI, etc) this admission?:  Yes                               AHA/ACC Clinical Performance & Quality Measures: Aspirin prescribed? - Yes ADP Receptor Inhibitor (Plavix/Clopidogrel, Brilinta/Ticagrelor or Effient/Prasugrel) prescribed (includes medically managed patients)? - Yes Beta Blocker prescribed? - Yes High Intensity Statin (Lipitor 40-80mg  or Crestor 20-40mg ) prescribed? -  Yes EF assessed during THIS hospitalization? - Yes For EF <40%, was ACEI/ARB prescribed? - Not Applicable (EF >/= 40%) For EF <40%, Aldosterone Antagonist (Spironolactone or Eplerenone) prescribed? - Not Applicable (EF >/= 40%) Cardiac Rehab Phase II ordered (including medically managed patients)? - Yes       _____________  Discharge Vitals Blood pressure 134/89, pulse 73, temperature (!) 97.4 F (36.3 C), resp. rate 20, height  (1.778 m), weight 70.4 kg, SpO2 95 %.  Filed Weights   07/04/20 0202 07/04/20 0600  Weight: 66.7 kg 70.4 kg    Labs & Radiologic Studies    CBC Recent Labs    07/04/20 0200 07/04/20 0200 07/04/20 0515 07/05/20 0028  WBC 15.5*   < > 14.0*  11.5*  NEUTROABS 9.8*  --   --   --   HGB 14.8   < > 13.7 12.9*  HCT 43.6   < > 37.8* 37.8*  MCV 95.0   < > 92.0 94.0  PLT 288   < > 308 245   < > = values in this interval not displayed.   Basic Metabolic Panel Recent Labs    16/10/96 0515 07/05/20 0028  NA 139 141  K 3.7 3.4*  CL 105 110  CO2 22 22  GLUCOSE 167* 102*  BUN 12 8  CREATININE 0.81 0.69  CALCIUM 9.4 8.8*   Liver Function Tests Recent Labs    07/04/20 0200  AST 15  ALT 18  ALKPHOS 55  BILITOT 0.5  PROT 8.1  ALBUMIN 4.8   No results for input(s): LIPASE, AMYLASE in the last 72 hours. High Sensitivity Troponin:   Recent Labs  Lab 07/04/20 0155 07/04/20 0515 07/04/20 0749  TROPONINIHS 268* >27,000* >27,000*    BNP Invalid input(s): POCBNP D-Dimer No results for input(s): DDIMER in the last 72 hours. Hemoglobin A1C Recent Labs    07/04/20 0157  HGBA1C 5.8*   Fasting Lipid Panel Recent Labs    07/04/20 0200  CHOL 178  HDL 31*  LDLCALC 112*  TRIG 174*  CHOLHDL 5.7   Thyroid Function Tests No results for input(s): TSH, T4TOTAL, T3FREE, THYROIDAB in the last 72 hours.  Invalid input(s): FREET3 _____________  DG Chest 1 View  Result Date: 07/04/2020 CLINICAL DATA:  Code STEMI EXAM: CHEST  1 VIEW COMPARISON:  October 08, 2019 FINDINGS: The heart size and mediastinal contours are within normal limits. Both lungs are clear. The visualized skeletal structures are unremarkable. IMPRESSION: No active disease. Electronically Signed   By: Katherine Mantle M.D.   On: 07/04/2020 02:18   CARDIAC CATHETERIZATION  Result Date: 07/04/2020  Prox RCA to Mid RCA lesion is 100% stenosed.  RPDA lesion is 100% stenosed.  Ost LAD to Prox LAD lesion is 20% stenosed.  Prox LAD to Mid LAD lesion is 20% stenosed.  Acute inferior ST segment elevation myocardial infarction secondary to thrombotic occlusion of the proximal to mid RCA with initial TIMI 0 flow. Mild 20% ostial narrowing of the LAD and 20% mid LAD  stenosis. Normal left circumflex coronary artery. Reperfusion mediated hypotension, idioventricular rhythm, requiring fluid resuscitation, IV amiodarone in addition to Levophed. Aggrastat administration with thrombotic occlusion and probable embolized thrombus to the very distal PDA. Successful percutaneous coronary intervention to the RCA with ultimate insertion of a 3.5 x 30 mm Resolute Onyx DES stent postdilated to 3.55 mm with the 100 % occlusion being reduced to 0% and restoration of brisk TIMI-3 flow. RECOMMENDATION: DAPT for minimum of 1 year.  2D echo Doppler study will be done in a.m. to assess RV and LV function.  Continued fluid resuscitation with weaning and discontinuance of Levophed as blood pressure allows.  Aggressive lipid-lowering therapy with target LDL less than 70.  Once Levophed is considered, initiate post MI beta-blocker and ACE/ARB.  Smoking cessation is essential.   ECHOCARDIOGRAM COMPLETE  Result Date: 07/04/2020    ECHOCARDIOGRAM REPORT   Patient Name:   ADIEL ERNEY Date of Exam: 07/04/2020 Medical Rec #:  [id number]       Height:       70.0 in Accession #:    [id number]      Weight:       155.2 lb Date of Birth:  1966/05/29       BSA:          1.874 m Patient Age:    54 years        BP:           109/83 mmHg Patient Gender: M               HR:           64 bpm. Exam Location:  Inpatient Procedure: 2D Echo, 3D Echo, Cardiac Doppler and Color Doppler Indications:    121-121.4 ST elevation (STEMI) and non-ST elevation (NSTEMI)                 myocardial infarction  History:        Patient has no prior history of Echocardiogram examinations.                 Signs/Symptoms:Chest Pain; Risk Factors:Dyslipidemia.  Sonographer:    Sheralyn Boatman RDCS Referring Phys: [phone] THOMAS A KELLY IMPRESSIONS  1. Left ventricular ejection fraction, by estimation, is 40 to 45%. The left ventricle has mildly decreased function. The left ventricle demonstrates regional wall motion abnormalities (see scoring  diagram/findings for description). There is mild concentric left ventricular hypertrophy. Left ventricular diastolic parameters were normal. There is mild hypokinesis of the left ventricular, basal-mid inferior wall. There is moderate hypokinesis of the left ventricular, basal-mid inferolateral wall.  2. Right ventricular systolic function is mildly reduced. The right ventricular size is mildly enlarged. Tricuspid regurgitation signal is inadequate for assessing PA pressure.  3. The mitral valve is normal in structure. No evidence of mitral valve regurgitation.  4. The aortic valve is tricuspid. Aortic valve regurgitation is not visualized. No aortic stenosis is present.  5. The inferior vena cava is dilated in size with >50% respiratory variability, suggesting right atrial pressure of 8 mmHg. FINDINGS  Left Ventricle: Left ventricular ejection fraction, by estimation, is 40 to 45%. The left ventricle has mildly decreased function. The left ventricle demonstrates regional wall motion abnormalities. Mild hypokinesis of the left ventricular, basal-mid inferior wall. Moderate hypokinesis of the left ventricular, basal-mid inferolateral wall. The left ventricular internal cavity size was normal in size. There is mild concentric left ventricular hypertrophy. Left ventricular diastolic parameters were normal. Normal left ventricular filling pressure. Right Ventricle: The right ventricular size is mildly enlarged. No increase in right ventricular wall thickness. Right ventricular systolic function is mildly reduced. Tricuspid regurgitation signal is inadequate for assessing PA pressure. Left Atrium: Left atrial size was normal in size. Right Atrium: Right atrial size was normal in size. Pericardium: There is no evidence of pericardial effusion. Mitral Valve: The mitral valve is normal in structure. No evidence of mitral valve regurgitation. Tricuspid Valve: The tricuspid valve is normal in  structure. Tricuspid valve  regurgitation is not demonstrated. Aortic Valve: The aortic valve is tricuspid. Aortic valve regurgitation is not visualized. No aortic stenosis is present. Pulmonic Valve: The pulmonic valve was grossly normal. Pulmonic valve regurgitation is not visualized. Aorta: The aortic root and ascending aorta are structurally normal, with no evidence of dilitation. Venous: The inferior vena cava is dilated in size with greater than 50% respiratory variability, suggesting right atrial pressure of 8 mmHg. IAS/Shunts: No atrial level shunt detected by color flow Doppler.  LEFT VENTRICLE PLAX 2D LVIDd:         4.70 cm      Diastology LVIDs:         4.00 cm      LV e' medial:    8.05 cm/s LV PW:         1.30 cm      LV E/e' medial:  8.4 LV IVS:        1.10 cm      LV e' lateral:   12.00 cm/s LVOT diam:     2.00 cm      LV E/e' lateral: 5.7 LV SV:         63 LV SV Index:   33 LVOT Area:     3.14 cm  LV Volumes (MOD) LV vol d, MOD A2C: 72.8 ml LV vol d, MOD A4C: 109.0 ml LV vol s, MOD A2C: 43.9 ml LV vol s, MOD A4C: 57.4 ml LV SV MOD A2C:     28.9 ml LV SV MOD A4C:     109.0 ml LV SV MOD BP:      38.6 ml RIGHT VENTRICLE            IVC RV S prime:     9.68 cm/s  IVC diam: 2.30 cm TAPSE (M-mode): 1.9 cm LEFT ATRIUM             Index       RIGHT ATRIUM           Index LA diam:        2.50 cm 1.33 cm/m  RA Area:     11.30 cm LA Vol (A2C):   21.9 ml 11.69 ml/m RA Volume:   27.00 ml  14.41 ml/m LA Vol (A4C):   30.0 ml 16.01 ml/m LA Biplane Vol: 27.8 ml 14.83 ml/m  AORTIC VALVE LVOT Vmax:   101.00 cm/s LVOT Vmean:  70.800 cm/s LVOT VTI:    0.199 m  AORTA Ao Root diam: 3.50 cm MITRAL VALVE MV Area (PHT): 2.51 cm    SHUNTS MV Decel Time: 302 msec    Systemic VTI:  0.20 m MV E velocity: 67.90 cm/s  Systemic Diam: 2.00 cm MV A velocity: 52.30 cm/s MV E/A ratio:  1.30 Mihai Croitoru MD Electronically signed by Thurmon Fair MD Signature Date/Time: 07/04/2020/4:11:34 PM    Final    Disposition   Pt is being discharged home today in  good condition.  Follow-up Plans & Appointments   Call [facility] at [phone] if any bleeding, swelling or drainage at cath site.  May shower, no tub baths for 48 hours for groin sticks. No lifting over 5 pounds for 3 days.  No Driving for 3 days  Take 1 NTG, under your tongue, while sitting.  If no relief of pain may repeat NTG, one tab every 5 minutes up to 3 tablets total over 15 minutes.  If no relief CALL 911.  If you  have dizziness/lightheadness  while taking NTG, stop taking and call 911.        Heart Healthy Diet    Do not stop Brilinta and asprin, they keep the stent open.  Stopping could cause a heart attack   Important to stop tobacco fortunately CXR without active disease.    Light duty at work only beginning 07/07/20 and further instructions to be determined by Dr. Dulce Sellar on visit     Follow-up Information     Baldo Daub, MD Follow up on 07/14/2020.   Specialty: Cardiology Why: at 1:40 PM - this is only location for this visit - rest can be in [employer] information: [address] [place] [address] [phone]                Discharge Instructions     Amb Referral to Cardiac Rehabilitation   Complete by: As directed    Diagnosis:  STEMI Coronary Stents     After initial evaluation and assessments completed: Virtual Based Care may be provided alone or in conjunction with Phase 2 Cardiac Rehab based on patient barriers.: Yes       Discharge Medications   Allergies as of 07/05/2020       Reactions   Ranitidine Swelling        Medication List     STOP taking these medications    ibuprofen 200 MG tablet Commonly known as: ADVIL       TAKE these medications    acetaminophen 325 MG tablet Commonly known as: TYLENOL Take 2 tablets (650 mg total) by mouth every 4 (four) hours as needed for headache or mild pain.   aspirin 81 MG chewable tablet Chew 1 tablet (81 mg total) by mouth  daily. Start taking on: July 06, 2020   atorvastatin 80 MG tablet Commonly known as: LIPITOR Take 1 tablet (80 mg total) by mouth daily. Start taking on: July 06, 2020   carvedilol 3.125 MG tablet Commonly known as: COREG Take 1 tablet (3.125 mg total) by mouth 2 (two) times daily with a meal.   COSENTYX [place] Inject 2 Doses into the skin every 21 ( twenty-one) days.   Cosentyx Sensoready (300 MG) 150 MG/ML [place] Generic drug: Secukinumab (300 MG Dose) Inject 2 Syringes into the skin every 28 (twenty-eight) days.   fluticasone 50 MCG/ACT nasal spray Commonly known as: FLONASE Place 1-2 sprays into both nostrils daily as needed for allergies or rhinitis.   losartan 25 MG tablet Commonly known as: COZAAR Take 1 tablet (25 mg total) by mouth daily. Start taking on: July 06, 2020   MINERAL ICE EX Apply 1 application topically as needed (foot pain).   nitroGLYCERIN 0.4 MG SL tablet Commonly known as: NITROSTAT Place 1 tablet (0.4 mg total) under the tongue every 5 (five) minutes as needed for chest pain.   omeprazole 40 MG capsule Commonly known as: PRILOSEC TAKE 1 CAPSULE (40 MG TOTAL) BY MOUTH 2 (TWO) TIMES DAILY BEFORE A MEAL.   ticagrelor 90 MG Tabs tablet Commonly known as: BRILINTA Take 1 tablet (90 mg total) by mouth 2 (two) times daily.           Outstanding Labs/Studies   Consider follow up on Echo with medication additions. Repeat Hepatic and lipids in 6 weeks.   Duration of Discharge Encounter   Greater than 30 minutes including physician time.  Signed, Nada Boozer, NP 07/05/2020, 12:11 PM   Attending Note:  The patient was seen and examined.  Agree with assessment and plan as noted above.  Changes made to the above note as needed.  Patient seen and independently examined with  Nada Boozer, NP .   We discussed all aspects of the encounter. I agree with the assessment and plan as stated above.    1/  Inferior STEMI:  S/p stenting of RCA.   Had some distal embolization of the thrombus down into the distal RCA and is getting Aggrastat for 18 hours.   His hematoma is stable.  Echocardiogram reveals mild LV dysfunction.  We will be starting carvedilol 3.125 mg twice a day and losartan 25 mg a day.  He lives in [place] and would like to follow-up with Dr. Dulce Sellar or one of his partners.     Cont asa, brilinta    2.   COPD:  Advised smoking cessation    4.  Hyperlipidemia: Lipid profile from yesterday reveals a total cholesterol of 178.  The HDL is 31.  LDL is 112.  Triglyceride level is 174.  He has been started on atorvastatin 80 mg a day.  I have advised him to work on reducing the amount of sugar and carbohydrates that he eats.  Of also advised him to work on cholesterol reduction.   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   I have spent a total of 40 minutes with patient reviewing hospital  notes , telemetry, EKGs, labs and examining patient as well as establishing an assessment and plan that was discussed with the patient.  > 50% of time was spent in direct patient care.    Vesta Mixer, Montez Hageman., MD, [facility] 07/06/2020, 2:44 PM [address],  [address] Office [phone] Pager [phone]

## 2020-07-06 ENCOUNTER — Encounter (HOSPITAL_COMMUNITY): Payer: Self-pay | Admitting: Cardiovascular Disease

## 2020-07-06 ENCOUNTER — Telehealth: Payer: Self-pay

## 2020-07-06 ENCOUNTER — Telehealth: Payer: Self-pay | Admitting: Podiatry

## 2020-07-06 NOTE — Telephone Encounter (Signed)
Received a message from Lurena Joiner in the [place] office that Matthew Patel needed to cancel his surgery with Dr. Samuella Cota on 07/26/2020. He stated he had a heart attack and was told that he will not be able to have surgery for 6 months to a year. Notified Aram Beecham at [facility] and Dr. Samuella Cota

## 2020-07-06 NOTE — Telephone Encounter (Signed)
Thanks for the update

## 2020-07-06 NOTE — Telephone Encounter (Signed)
Patients wife called in stating they are not interested in the shockwave therapy, can't afford it at the moment

## 2020-07-06 NOTE — Telephone Encounter (Signed)
Pt stated they would be unable to schedule due to finances

## 2020-07-06 NOTE — Telephone Encounter (Signed)
As previously notified pt had to cancel surgery due to heart attack.  Christine Dunn (sig. Other)callled & they are  wanting to know if there were any other options you could offer to help alleviate pt's issue with foot until he could have surgery done.

## 2020-07-06 NOTE — Telephone Encounter (Signed)
Left vm to call back/reb

## 2020-07-06 NOTE — Telephone Encounter (Signed)
He could do Shockwave therapy in [place]. It is non-surgical but not covered by insurance. It is $600 for 4 treatments. Can you see if they would be interested in that?

## 2020-07-10 ENCOUNTER — Telehealth: Payer: Self-pay | Admitting: General Practice

## 2020-07-10 NOTE — Telephone Encounter (Signed)
[employer] called to see if patient was scheduled with [facility].

## 2020-07-12 ENCOUNTER — Telehealth (HOSPITAL_COMMUNITY): Payer: Self-pay

## 2020-07-12 NOTE — Telephone Encounter (Signed)
Attempted to call patient in regards to Cardiac Rehab - LM on VM 

## 2020-07-14 ENCOUNTER — Other Ambulatory Visit: Payer: Self-pay

## 2020-07-14 ENCOUNTER — Encounter: Payer: Self-pay | Admitting: Cardiology

## 2020-07-14 ENCOUNTER — Ambulatory Visit: Payer: Managed Care, Other (non HMO) | Admitting: Cardiology

## 2020-07-14 VITALS — BP 92/68 | HR 66 | Ht 68.0 in | Wt 149.0 lb

## 2020-07-14 DIAGNOSIS — Z720 Tobacco use: Secondary | ICD-10-CM

## 2020-07-14 DIAGNOSIS — I2510 Atherosclerotic heart disease of native coronary artery without angina pectoris: Secondary | ICD-10-CM

## 2020-07-14 DIAGNOSIS — I952 Hypotension due to drugs: Secondary | ICD-10-CM

## 2020-07-14 DIAGNOSIS — E782 Mixed hyperlipidemia: Secondary | ICD-10-CM

## 2020-07-14 MED ORDER — METOPROLOL SUCCINATE ER 25 MG PO TB24: 25.0000 mg | ORAL_TABLET | Freq: Every day | ORAL | 3 refills | Status: DC

## 2020-07-14 NOTE — Progress Notes (Signed)
Cardiology Office Note:    Date:  07/14/2020   ID:  ZEPHANIAH LUBRANO, DOB 12/12/1965, MRN [id number]  PCP:  Abigail Miyamoto, MD  Cardiologist:  Norman Herrlich, MD   Referring MD: No ref. provider found  ASSESSMENT:    1. Hypotension due to drugs   2. CAD in native artery   3. Mixed hyperlipidemia   4. Tobacco abuse    PLAN:    In order of problems listed above:  1. His ejection fraction was mildly reduced he is placed on carvedilol ARB unfortunately is developed symptomatic hypotension.  I will stop both drugs I will place him on a low-dose of a selective beta-blocker post MI.  His wife will begin to monitor blood pressure and pulse at home. 2. Stable after recent MI.  Continue his dual antiplatelet therapy uninterrupted lipid-lowering strongly encouraged him not to resume smoking.  I will see him in the office in a month and I think he can safely return to his job description after the holiday January 3 3. Continue high intensity statin when I see him back in the office in a month check lipids and LP(a) with family history 4. He is doing very well not smoking advised nicotine substitutes if needed  Next appointment 1 month   Medication Adjustments/Labs and Tests Ordered: Current medicines are reviewed at length with the patient today.  Concerns regarding medicines are outlined above.  No orders of the defined types were placed in this encounter.  Meds ordered this encounter  Medications   metoprolol succinate (TOPROL XL) 25 MG 24 hr tablet    Sig: Take 1 tablet (25 mg total) by mouth daily.    Dispense:  90 tablet    Refill:  3     Chief Complaint  Patient presents with   Follow-up   Coronary Artery Disease    Recent ST elevation MI PCI and stent right coronary artery 07/04/2020   Hyperlipidemia    History of Present Illness:    Matthew Patel is a 54 y.o. male who is being seen today in follow-up to [facility] admission.  He was  admitted to  [facility] 07/04/2020 to 07/05/2020 with acute ST elevation myocardial infarction involving the right coronary artery.  He had PCI and stent drug-eluting 07/04/2020 other problems include hyperlipidemia COPD tobacco abuse and ischemic cardiomyopathy.  His course was complicated by reperfusion mediated hypotension idioventricular rhythm requiring fluid resuscitation IV pressor Levophed and IV amiodarone.  Echocardiogram showed EF of 40 to 45%.  High-sensitivity troponin was greater than 27,000 normal renal function.  Medication regimen includes dual antiplatelet therapy with aspirin and ticagrelor along with a high sensitivity statin beta-blocker and ARB.  His wife is a CNA he has had lightheadedness and she is worried about his blood pressure.  Today his systolic is 92 on carvedilol and an ARB.  He has no exertional chest pain but at times at rest he will get brief chest discomfort relieved with activity is not pleuritic in nature no shortness of breath fever chills.  He is declined cardiac rehabilitation.  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  She is very helpful and he has not smoked since hospital discharge.  I advised him to have nicotine substitute patch gum or lozenge available to use as needed Past Medical History:  Diagnosis Date   CAD in native artery 07/05/2020   Cardiomyopathy, ischemic 07/05/2020   GERD (gastroesophageal reflux disease)  Migraines    Mixed hyperlipidemia    S/P angioplasty with stent RCA DES 07/04/20 07/05/2020   Tobacco abuse 07/05/2020    Past Surgical History:  Procedure Laterality Date   CORONARY/GRAFT ACUTE MI REVASCULARIZATION N/A 07/04/2020   Procedure: Coronary/Graft Acute MI Revascularization;  Surgeon: Lennette Bihari, MD;  Location: [facility] INVASIVE CV LAB;  Service: Cardiovascular;  Laterality: N/A;   LEFT HEART CATH AND CORONARY ANGIOGRAPHY N/A 07/04/2020   Procedure: LEFT HEART CATH AND CORONARY ANGIOGRAPHY;  Surgeon: Lennette Bihari, MD;  Location: [facility] INVASIVE CV LAB;  Service: Cardiovascular;  Laterality: N/A;    Current Medications: Current Meds  Medication Sig   acetaminophen (TYLENOL) 325 MG tablet Take 2 tablets (650 mg total) by mouth every 4 (four) hours as needed for headache or mild pain.   aspirin 81 MG chewable tablet Chew 1 tablet (81 mg total) by mouth daily.   atorvastatin (LIPITOR) 80 MG tablet Take 1 tablet (80 mg total) by mouth daily.   COSENTYX SENSOREADY, 300 MG, 150 MG/ML SOAJ Inject 2 Syringes into the skin every 28 (twenty-eight) days.   fluticasone (FLONASE) 50 MCG/ACT nasal spray Place 1-2 sprays into both nostrils daily as needed for allergies or rhinitis.   Menthol, Topical Analgesic, (MINERAL ICE EX) Apply 1 application topically as needed (foot pain).   nitroGLYCERIN (NITROSTAT) 0.4 MG SL tablet Place 1 tablet (0.4 mg total) under the tongue every 5 (five) minutes as needed for chest pain.   omeprazole (PRILOSEC) 40 MG capsule TAKE 1 CAPSULE (40 MG TOTAL) BY MOUTH 2 (TWO) TIMES DAILY BEFORE A MEAL.   Secukinumab (COSENTYX [place]) Inject 2 Doses into the skin every 21 ( twenty-one) days.   ticagrelor (BRILINTA) 90 MG TABS tablet Take 1 tablet (90 mg total) by mouth 2 (two) times daily.   [DISCONTINUED] carvedilol (COREG) 3.125 MG tablet Take 1 tablet (3.125 mg total) by mouth 2 (two) times daily with a meal.   [DISCONTINUED] losartan (COZAAR) 25 MG tablet Take 1 tablet (25 mg total) by mouth daily.     Allergies:   Ranitidine   Social History   Socioeconomic History   Marital status: Single    Spouse name: Not on file   Number of children: Not on file   Years of education: Not on file   Highest education level: Not on file  Occupational History   Not on file  Tobacco Use   Smoking status: Current Every Day Smoker    Packs/day: 1.50    Types: Cigarettes   Smokeless tobacco: Never Used  Vaping Use   Vaping Use: Never used  Substance and Sexual Activity    Alcohol use: Never   Drug use: Never   Sexual activity: Not on file  Other Topics Concern   Not on file  Social History Narrative   Not on file   Social Determinants of Health   Financial Resource Strain: Not on file  Food Insecurity: Not on file  Transportation Needs: Not on file  Physical Activity: Not on file  Stress: Not on file  Social Connections: Not on file     Family History: The patient's Family history is unknown by patient.  ROS:   ROS Please see the history of present illness.     All other systems reviewed and are negative.  EKGs/Labs/Other Studies Reviewed:    The following studies were reviewed today:   EKG: His [facility] EKG 07/04/2020 independently reviewed inferior MI with T wave inversions age  indeterminate likely recent otherwise normal  Recent Labs: 07/04/2020: ALT 18 07/05/2020: BUN 8; Creatinine, Ser 0.69; Hemoglobin 12.9; Platelets 245; Potassium 3.4; Sodium 141  Recent Lipid Panel    Component Value Date/Time   CHOL 178 07/04/2020 0200   TRIG 174 (H) 07/04/2020 0200   HDL 31 (L) 07/04/2020 0200   CHOLHDL 5.7 07/04/2020 0200   VLDL 35 07/04/2020 0200   LDLCALC 112 (H) 07/04/2020 0200    Physical Exam:    VS:  BP 92/68    Pulse 66    Ht 5\' 8"  (1.727 m)    Wt 149 lb (67.6 kg)    SpO2 98%    BMI 22.66 kg/m     Wt Readings from Last 3 Encounters:  07/14/20 149 lb (67.6 kg)  07/04/20 155 lb 3.3 oz (70.4 kg)  02/08/20 150 lb 3.2 oz (68.1 kg)     GEN:  Well nourished, well developed in no acute distress HEENT: Normal NECK: No JVD; No carotid bruits LYMPHATICS: No lymphadenopathy CARDIAC: RRR, no murmurs, rubs, gallops RESPIRATORY:  Clear to auscultation without rales, wheezing or rhonchi  ABDOMEN: Soft, non-tender, non-distended MUSCULOSKELETAL:  No edema; No deformity  SKIN: Warm and dry NEUROLOGIC:  Alert and oriented x 3 PSYCHIATRIC:  Normal affect     Signed, 02/10/20, MD  07/14/2020 3:13 PM    [facility]

## 2020-07-14 NOTE — Patient Instructions (Signed)
Medication Instructions:  Your physician has recommended you make the following change in your medication:  STOP: Carvedilol STOP: Losartan START: TOPROL XL 25 mg take one tablet by mouth daily.  *If you need a refill on your cardiac medications before your next appointment, please call your pharmacy*   Lab Work: None If you have labs (blood work) drawn today and your tests are completely normal, you will receive your results only by:  MyChart Message (if you have MyChart) OR  A paper copy in the mail If you have any lab test that is abnormal or we need to change your treatment, we will call you to review the results.   Testing/Procedures: None   Follow-Up: At [facility], you and your health needs are our priority.  As part of our continuing mission to provide you with exceptional heart care, we have created designated Provider Care Teams.  These Care Teams include your primary Cardiologist (physician) and Advanced Practice Providers (APPs -  Physician Assistants and Nurse Practitioners) who all work together to provide you with the care you need, when you need it.  We recommend signing up for the patient portal called "MyChart".  Sign up information is provided on this After Visit Summary.  MyChart is used to connect with patients for Virtual Visits (Telemedicine).  Patients are able to view lab/test results, encounter notes, upcoming appointments, etc.  Non-urgent messages can be sent to your provider as well.   To learn more about what you can do with MyChart, go to [email or web address].    Your next appointment:   1 month(s)  The format for your next appointment:   In Person  Provider:   Norman Herrlich, MD   Other Instructions

## 2020-07-31 ENCOUNTER — Ambulatory Visit: Payer: Managed Care, Other (non HMO) | Admitting: Cardiology

## 2020-07-31 ENCOUNTER — Other Ambulatory Visit: Payer: Self-pay

## 2020-07-31 ENCOUNTER — Ambulatory Visit: Payer: Managed Care, Other (non HMO) | Admitting: Legal Medicine

## 2020-07-31 ENCOUNTER — Encounter: Payer: Self-pay | Admitting: Podiatry

## 2020-07-31 ENCOUNTER — Ambulatory Visit: Payer: Managed Care, Other (non HMO) | Admitting: Podiatry

## 2020-07-31 DIAGNOSIS — L988 Other specified disorders of the skin and subcutaneous tissue: Secondary | ICD-10-CM

## 2020-07-31 DIAGNOSIS — Z9289 Personal history of other medical treatment: Secondary | ICD-10-CM

## 2020-07-31 DIAGNOSIS — L409 Psoriasis, unspecified: Secondary | ICD-10-CM

## 2020-07-31 DIAGNOSIS — L97422 Non-pressure chronic ulcer of left heel and midfoot with fat layer exposed: Secondary | ICD-10-CM | POA: Diagnosis not present

## 2020-07-31 DIAGNOSIS — R234 Changes in skin texture: Secondary | ICD-10-CM | POA: Diagnosis not present

## 2020-07-31 MED ORDER — GENTAMICIN SULFATE 0.1 % EX CREA: 1.0000 "application " | TOPICAL_CREAM | Freq: Every day | CUTANEOUS | 2 refills | Status: AC

## 2020-08-01 NOTE — Progress Notes (Signed)
  Subjective:  Patient ID: ALIKA SALADIN, male    DOB: 1966/06/06,  MRN: [id number]  Chief Complaint  Patient presents with  . Foot Ulcer    Patient presents today for ulcer bottom of left heel x 2 months. He states "it feels like something is stabbing me when I walk and its really painful"      55 y.o. male presents with the above complaint. History confirmed with patient.  He is well-known to Dr. Samuella Cota.  He was scheduled for a endoscopic plantar fascial tenotomy in December, however it was canceled after having a heart attack on December 7.  He is feeling better as far as cardiac symptoms go now.  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  He is on biologic therapy for psoriasis.  The lesion is very painful.  It was draining green drainage recently.  Objective:  Physical Exam: warm, good capillary refill, no trophic changes or ulcerative lesions, normal DP and PT pulses and normal sensory exam. Left Foot: Plantar heel there is a 6 mm x 2 mm x 2 mm painful draining fissure   Assessment:  No diagnosis found.   Plan:  Patient was evaluated and treated and all questions answered.   Ulcer plantar left heel -Debridement as below. -Dressed with Silvadene, DSD. -Gentamicin cream sent to pharmacy will dress with this daily -Recommend offloading with a surgical shoe and peg assist which was dispensed -Discussed with him and his wife that if this does not heal by secondary intention with local wound care soon should consider excision and/or debridement under anesthesia as it is very painful to bring the office -Believe in [place].  Will follow up with Dr. Samuella Cota there  Procedure: Excisional Debridement of Wound Rationale: Removal of non-viable soft tissue from the wound to promote healing.  Anesthesia: none Pre-Debridement Wound Measurements: Unmeasurable due to overlying scab Post-Debridement Wound Measurements: 0.6 x 0.2 x 0.2  cm Type of Debridement: Sharp selective Tissue Removed: Non-viable soft tissue Depth of Debridement: subcutaneous tissue. Technique: Sharp selective debridement to bleeding, viable wound base.  Dressing: Dry, sterile, compression dressing. Disposition: Patient tolerated procedure well.     Return in about 3 weeks (around 08/21/2020) for ulcer left heel.

## 2020-08-02 ENCOUNTER — Ambulatory Visit: Payer: Managed Care, Other (non HMO) | Admitting: Legal Medicine

## 2020-08-03 ENCOUNTER — Encounter: Payer: Managed Care, Other (non HMO) | Admitting: Podiatry

## 2020-08-10 ENCOUNTER — Encounter: Payer: Managed Care, Other (non HMO) | Admitting: Podiatry

## 2020-08-14 DIAGNOSIS — G43909 Migraine, unspecified, not intractable, without status migrainosus: Secondary | ICD-10-CM | POA: Insufficient documentation

## 2020-08-18 NOTE — Progress Notes (Deleted)
Cardiology Office Note:    Date:  08/21/2020   ID:  Matthew Patel, DOB 28-Apr-1966, MRN [id number]  PCP:  Matthew Miyamoto, MD  Cardiologist:  Matthew Herrlich, MD    Referring MD: Matthew Patel,*    ASSESSMENT:    1. Hypotension due to drugs   2. CAD in native artery   3. Mixed hyperlipidemia   4. LV dysfunction    PLAN:    In order of problems listed above:  1. ***   Next appointment: ***   Medication Adjustments/Labs and Tests Ordered: Current medicines are reviewed at length with the patient today.  Concerns regarding medicines are outlined above.  No orders of the defined types were placed in this encounter.  No orders of the defined types were placed in this encounter.   No chief complaint on file.   History of Present Illness:    Matthew Patel is a 55 y.o. male with a hx of CAD with ST elevation MI and PCI and drug-eluting stent right coronary artery 01/2020.  His course was complicated by reperfusion mediated hypotension and idioventricular rhythm requiring fluid resuscitation IV pressors Levophed and IV amiodarone for heart rhythm control.  High-sensitivity troponin was greater than 27,000 as echocardiogram showed ejection fraction of 40 to 45%.  He was placed on guideline directed therapy carvedilol and ARB and subsequently developed symptomatic hypotension.  He was last seen 07/14/2020 I stopped a vasodilator transition him to Toprol-XL arrangements for follow-up in the office and plan to check LP(a) with his next laboratory test.  Noteworthy is he is not smoking.. Compliance with diet, lifestyle and medications: *** Past Medical History:  Diagnosis Date  . CAD in native artery 07/05/2020  . Cardiomyopathy, ischemic 07/05/2020  . GERD (gastroesophageal reflux disease)   . Migraines   . Mixed hyperlipidemia   . S/P angioplasty with stent RCA DES 07/04/20 07/05/2020  . Tobacco abuse 07/05/2020    Past Surgical History:  Procedure Laterality Date   . CORONARY/GRAFT ACUTE MI REVASCULARIZATION N/A 07/04/2020   Procedure: Coronary/Graft Acute MI Revascularization;  Surgeon: Matthew Bihari, MD;  Location: [facility] INVASIVE CV LAB;  Service: Cardiovascular;  Laterality: N/A;  . LEFT HEART CATH AND CORONARY ANGIOGRAPHY N/A 07/04/2020   Procedure: LEFT HEART CATH AND CORONARY ANGIOGRAPHY;  Surgeon: Matthew Bihari, MD;  Location: [facility] INVASIVE CV LAB;  Service: Cardiovascular;  Laterality: N/A;    Current Medications: No outpatient medications have been marked as taking for the 08/21/20 encounter (Appointment) with Baldo Daub, MD.     Allergies:   Ranitidine   Social History   Socioeconomic History  . Marital status: Single    Spouse name: Not on file  . Number of children: Not on file  . Years of education: Not on file  . Highest education level: Not on file  Occupational History  . Not on file  Tobacco Use  . Smoking status: Current Every Day Smoker    Packs/day: 1.50    Types: Cigarettes  . Smokeless tobacco: Never Used  Vaping Use  . Vaping Use: Never used  Substance and Sexual Activity  . Alcohol use: Never  . Drug use: Never  . Sexual activity: Not on file  Other Topics Concern  . Not on file  Social History Narrative  . Not on file   Social Determinants of Health   Financial Resource Strain: Not on file  Food Insecurity: Not on file  Transportation Needs: Not on file  Physical  Activity: Not on file  Stress: Not on file  Social Connections: Not on file     Family History: The patient's ***family history includes Diabetes in an other family member; Heart disease in an other family member; Hypertension in an other family member. ROS:   Please see the history of present illness.    All other systems reviewed and are negative.  EKGs/Labs/Other Studies Reviewed:    The following studies were reviewed today:  EKG:  EKG ordered today and personally reviewed.  The ekg ordered today demonstrates ***  Recent  Labs: 07/04/2020: ALT 18 07/05/2020: BUN 8; Creatinine, Ser 0.69; Hemoglobin 12.9; Platelets 245; Potassium 3.4; Sodium 141  Recent Lipid Panel    Component Value Date/Time   CHOL 178 07/04/2020 0200   TRIG 174 (H) 07/04/2020 0200   HDL 31 (L) 07/04/2020 0200   CHOLHDL 5.7 07/04/2020 0200   VLDL 35 07/04/2020 0200   LDLCALC 112 (H) 07/04/2020 0200    Physical Exam:    VS:  There were no vitals taken for this visit.    Wt Readings from Last 3 Encounters:  07/14/20 149 lb (67.6 kg)  07/04/20 155 lb 3.3 oz (70.4 kg)  02/08/20 150 lb 3.2 oz (68.1 kg)     GEN: *** Well nourished, well developed in no acute distress HEENT: Normal NECK: No JVD; No carotid bruits LYMPHATICS: No lymphadenopathy CARDIAC: ***RRR, no murmurs, rubs, gallops RESPIRATORY:  Clear to auscultation without rales, wheezing or rhonchi  ABDOMEN: Soft, non-tender, non-distended MUSCULOSKELETAL:  No edema; No deformity  SKIN: Warm and dry NEUROLOGIC:  Alert and oriented x 3 PSYCHIATRIC:  Normal affect    Signed, Matthew Herrlich, MD  08/21/2020 8:09 AM    [facility]

## 2020-08-21 ENCOUNTER — Ambulatory Visit (INDEPENDENT_AMBULATORY_CARE_PROVIDER_SITE_OTHER): Payer: Managed Care, Other (non HMO) | Admitting: Podiatry

## 2020-08-21 ENCOUNTER — Ambulatory Visit: Payer: Managed Care, Other (non HMO) | Admitting: Cardiology

## 2020-08-21 DIAGNOSIS — Z5329 Procedure and treatment not carried out because of patient's decision for other reasons: Secondary | ICD-10-CM

## 2020-08-21 NOTE — Progress Notes (Signed)
Same day cancellation

## 2020-08-24 ENCOUNTER — Encounter: Payer: Managed Care, Other (non HMO) | Admitting: Podiatry

## 2020-08-30 ENCOUNTER — Other Ambulatory Visit: Payer: Self-pay

## 2020-08-30 ENCOUNTER — Ambulatory Visit: Payer: Managed Care, Other (non HMO) | Admitting: Cardiology

## 2020-08-30 ENCOUNTER — Encounter: Payer: Self-pay | Admitting: Cardiology

## 2020-08-30 VITALS — BP 128/84 | HR 66 | Ht 68.0 in | Wt 153.0 lb

## 2020-08-30 DIAGNOSIS — I255 Ischemic cardiomyopathy: Secondary | ICD-10-CM | POA: Diagnosis not present

## 2020-08-30 DIAGNOSIS — I952 Hypotension due to drugs: Secondary | ICD-10-CM

## 2020-08-30 DIAGNOSIS — E782 Mixed hyperlipidemia: Secondary | ICD-10-CM | POA: Diagnosis not present

## 2020-08-30 DIAGNOSIS — I2510 Atherosclerotic heart disease of native coronary artery without angina pectoris: Secondary | ICD-10-CM

## 2020-08-30 NOTE — Patient Instructions (Signed)
Medication Instructions:  Your physician recommends that you continue on your current medications as directed. Please refer to the Current Medication list given to you today.  *If you need a refill on your cardiac medications before your next appointment, please call your pharmacy*   Lab Work: Your physician recommends that you return for lab work in: TODAY CMP, Lipids, Lpa If you have labs (blood work) drawn today and your tests are completely normal, you will receive your results only by: Marland Kitchen MyChart Message (if you have MyChart) OR . A paper copy in the mail If you have any lab test that is abnormal or we need to change your treatment, we will call you to review the results.   Testing/Procedures: Your physician has requested that you have an echocardiogram. Echocardiography is a painless test that uses sound waves to create images of your heart. It provides your doctor with information about the size and shape of your heart and how well your heart's chambers and valves are working. This procedure takes approximately one hour. There are no restrictions for this procedure.     Follow-Up: At [facility], you and your health needs are our priority.  As part of our continuing mission to provide you with exceptional heart care, we have created designated Provider Care Teams.  These Care Teams include your primary Cardiologist (physician) and Advanced Practice Providers (APPs -  Physician Assistants and Nurse Practitioners) who all work together to provide you with the care you need, when you need it.  We recommend signing up for the patient portal called "MyChart".  Sign up information is provided on this After Visit Summary.  MyChart is used to connect with patients for Virtual Visits (Telemedicine).  Patients are able to view lab/test results, encounter notes, upcoming appointments, etc.  Non-urgent messages can be sent to your provider as well.   To learn more about what you can do with  MyChart, go to [email or web address].    Your next appointment:   6 week(s)  The format for your next appointment:   In Person  Provider:   Norman Herrlich, MD   Other Instructions

## 2020-08-30 NOTE — Progress Notes (Signed)
Cardiology Office Note:    Date:  08/30/2020   ID:  BACH ROCCHI, DOB 1966/01/28, MRN [id number]  PCP:  Abigail Miyamoto, MD  Cardiologist:  Norman Herrlich, MD    Referring MD: Abigail Miyamoto,*    ASSESSMENT:    1. CAD in native artery   2. Mixed hyperlipidemia   3. Cardiomyopathy, ischemic   4. Hypotension due to drugs    PLAN:    In order of problems listed above:  He is improved from his last visit with resolution of his hypotension and fortunately tolerates guideline directed therapy including low-dose ARB dual antiplatelet and statin. Recheck labs including lipid profile and screen LP(a) goal LDL less than 55 Recheck echocardiogram I suspect he had stunned myocardium he will have a marked improvement Hypotension has resolved  Next appointment: 3 months   Medication Adjustments/Labs and Tests Ordered: Current medicines are reviewed at length with the patient today.  Concerns regarding medicines are outlined above.  Orders Placed This Encounter  Procedures  . Lipoprotein A (LPA)  . Lipid panel  . Comprehensive metabolic panel  . ECHOCARDIOGRAM COMPLETE   No orders of the defined types were placed in this encounter.   No chief complaint on file.   History of Present Illness:    Matthew Patel is a 55 y.o. male with a hx of CAD with ST elevation MI and PCI and drug-eluting stent right coronary artery 01/2020.  His course was complicated by reperfusion mediated hypotension and idioventricular rhythm requiring fluid resuscitation IV pressors Levophed and IV amiodarone for heart rhythm control.  High-sensitivity troponin was greater than 27,000 as echocardiogram showed ejection fraction of 40 to 45%.  He was placed on guideline directed therapy carvedilol and ARB and subsequently developed symptomatic hypotension.  He was last seen 07/14/2020 I stopped a vasodilator transition him to Toprol-XL arrangements for follow-up in the office and plan to check LP(a)  with his next laboratory test.  Noteworthy is he is not smoking.  His wife is present participates in evaluation decision making. He is returned to work and has not had any difficulties He continues to abstain from smoking and I discussed with him strategies to avoid failure using either nicotine patch or lozenge. He asked about elective foot surgery for plantar fasciitis and I told him is not an option at this time with his dual antiplatelet therapy He has had no further hypotension his wife checks blood pressure at home He has had no edema shortness of breath chest pain palpitation and syncope and feels as if he is recovered. Is compliant with his medications including lipid-lowering without muscle pain or weakness Past Medical History:  Diagnosis Date  . CAD in native artery 07/05/2020  . Cardiomyopathy, ischemic 07/05/2020  . GERD (gastroesophageal reflux disease)   . Migraines   . Mixed hyperlipidemia   . S/P angioplasty with stent RCA DES 07/04/20 07/05/2020  . Tobacco abuse 07/05/2020    Past Surgical History:  Procedure Laterality Date  . CORONARY/GRAFT ACUTE MI REVASCULARIZATION N/A 07/04/2020   Procedure: Coronary/Graft Acute MI Revascularization;  Surgeon: Lennette Bihari, MD;  Location: [facility] INVASIVE CV LAB;  Service: Cardiovascular;  Laterality: N/A;  . LEFT HEART CATH AND CORONARY ANGIOGRAPHY N/A 07/04/2020   Procedure: LEFT HEART CATH AND CORONARY ANGIOGRAPHY;  Surgeon: Lennette Bihari, MD;  Location: [facility] INVASIVE CV LAB;  Service: Cardiovascular;  Laterality: N/A;    Current Medications: Current Meds  Medication Sig  . acetaminophen (TYLENOL) 325 MG  tablet Take 2 tablets (650 mg total) by mouth every 4 (four) hours as needed for headache or mild pain.  Marland Kitchen aspirin 81 MG chewable tablet Chew 1 tablet (81 mg total) by mouth daily.  Marland Kitchen atorvastatin (LIPITOR) 80 MG tablet Take 1 tablet (80 mg total) by mouth daily.  . COSENTYX SENSOREADY, 300 MG, 150 MG/ML SOAJ Inject 2 Syringes into  the skin every 28 (twenty-eight) days.  . fluticasone (FLONASE) 50 MCG/ACT nasal spray Place 1-2 sprays into both nostrils daily as needed for allergies or rhinitis.  Marland Kitchen gentamicin cream (GARAMYCIN) 0.1 % Apply 1 application topically daily.  Marland Kitchen losartan (COZAAR) 25 MG tablet Take 25 mg by mouth daily.  . Menthol, Topical Analgesic, (MINERAL ICE EX) Apply 1 application topically as needed (foot pain).  . nitroGLYCERIN (NITROSTAT) 0.4 MG SL tablet Place 1 tablet (0.4 mg total) under the tongue every 5 (five) minutes as needed for chest pain.  Marland Kitchen omeprazole (PRILOSEC) 40 MG capsule TAKE 1 CAPSULE (40 MG TOTAL) BY MOUTH 2 (TWO) TIMES DAILY BEFORE A MEAL.  Marland Kitchen ticagrelor (BRILINTA) 90 MG TABS tablet Take 1 tablet (90 mg total) by mouth 2 (two) times daily.     Allergies:   Ranitidine   Social History   Socioeconomic History  . Marital status: Single    Spouse name: Not on file  . Number of children: Not on file  . Years of education: Not on file  . Highest education level: Not on file  Occupational History  . Not on file  Tobacco Use  . Smoking status: Current Every Day Smoker    Packs/day: 1.50    Types: Cigarettes  . Smokeless tobacco: Never Used  Vaping Use  . Vaping Use: Never used  Substance and Sexual Activity  . Alcohol use: Never  . Drug use: Never  . Sexual activity: Not on file  Other Topics Concern  . Not on file  Social History Narrative  . Not on file   Social Determinants of Health   Financial Resource Strain: Not on file  Food Insecurity: Not on file  Transportation Needs: Not on file  Physical Activity: Not on file  Stress: Not on file  Social Connections: Not on file     Family History: The patient's family history includes Diabetes in an other family member; Heart disease in an other family member; Hypertension in an other family member. ROS:   Please see the history of present illness.    All other systems reviewed and are negative.  EKGs/Labs/Other  Studies Reviewed:    The following studies were reviewed today:   Recent Labs: 07/04/2020: ALT 18 07/05/2020: BUN 8; Creatinine, Ser 0.69; Hemoglobin 12.9; Platelets 245; Potassium 3.4; Sodium 141  Recent Lipid Panel    Component Value Date/Time   CHOL 178 07/04/2020 0200   TRIG 174 (H) 07/04/2020 0200   HDL 31 (L) 07/04/2020 0200   CHOLHDL 5.7 07/04/2020 0200   VLDL 35 07/04/2020 0200   LDLCALC 112 (H) 07/04/2020 0200    Physical Exam:    VS:  BP 128/84   Pulse 66   Ht 5\' 8"  (1.727 m)   Wt 153 lb (69.4 kg)   SpO2 99%   BMI 23.26 kg/m     Wt Readings from Last 3 Encounters:  08/30/20 153 lb (69.4 kg)  07/14/20 149 lb (67.6 kg)  07/04/20 155 lb 3.3 oz (70.4 kg)     GEN:  Well nourished, well developed in no acute distress HEENT:  Normal NECK: No JVD; No carotid bruits LYMPHATICS: No lymphadenopathy CARDIAC: RRR, no murmurs, rubs, gallops RESPIRATORY:  Clear to auscultation without rales, wheezing or rhonchi  ABDOMEN: Soft, non-tender, non-distended MUSCULOSKELETAL:  No edema; No deformity  SKIN: Warm and dry NEUROLOGIC:  Alert and oriented x 3 PSYCHIATRIC:  Normal affect    Signed, Norman Herrlich, MD  08/30/2020 11:56 AM    [facility]

## 2020-08-31 LAB — LIPID PANEL
Chol/HDL Ratio: 2.5 ratio (ref 0.0–5.0)
Cholesterol, Total: 90 mg/dL — ABNORMAL LOW (ref 100–199)
HDL: 36 mg/dL — ABNORMAL LOW (ref >39)
LDL Chol Calc (NIH): 38 mg/dL (ref 0–99)
Triglycerides: 74 mg/dL (ref 0–149)
VLDL Cholesterol Cal: 16 mg/dL (ref 5–40)

## 2020-08-31 LAB — COMPREHENSIVE METABOLIC PANEL
ALT: 29 IU/L (ref 0–44)
AST: 12 IU/L (ref 0–40)
Albumin/Globulin Ratio: 1.6 (ref 1.2–2.2)
Albumin: 4.6 g/dL (ref 3.8–4.9)
Alkaline Phosphatase: 98 IU/L (ref 44–121)
BUN/Creatinine Ratio: 13 (ref 9–20)
BUN: 11 mg/dL (ref 6–24)
Bilirubin Total: 0.9 mg/dL (ref 0.0–1.2)
CO2: 27 mmol/L (ref 20–29)
Calcium: 10 mg/dL (ref 8.7–10.2)
Chloride: 104 mmol/L (ref 96–106)
Creatinine, Ser: 0.82 mg/dL (ref 0.76–1.27)
GFR calc Af Amer: 118 mL/min/{1.73_m2} (ref >59)
GFR calc non Af Amer: 102 mL/min/{1.73_m2} (ref >59)
Globulin, Total: 2.9 g/dL (ref 1.5–4.5)
Glucose: 102 mg/dL — ABNORMAL HIGH (ref 65–99)
Potassium: 4.4 mmol/L (ref 3.5–5.2)
Sodium: 144 mmol/L (ref 134–144)
Total Protein: 7.5 g/dL (ref 6.0–8.5)

## 2020-08-31 LAB — LIPOPROTEIN A (LPA): Lipoprotein (a): 13.2 nmol/L (ref <75.0)

## 2020-09-27 ENCOUNTER — Telehealth: Payer: Self-pay | Admitting: Cardiology

## 2020-09-27 ENCOUNTER — Ambulatory Visit (HOSPITAL_BASED_OUTPATIENT_CLINIC_OR_DEPARTMENT_OTHER)
Admission: RE | Admit: 2020-09-27 | Discharge: 2020-09-27 | Disposition: A | Discharge status: Home / Self Care | Payer: Managed Care, Other (non HMO) | Source: Ambulatory Visit | Attending: Cardiology | Admitting: Cardiology

## 2020-09-27 ENCOUNTER — Other Ambulatory Visit: Payer: Self-pay

## 2020-09-27 DIAGNOSIS — I255 Ischemic cardiomyopathy: Secondary | ICD-10-CM | POA: Diagnosis not present

## 2020-09-27 LAB — ECHOCARDIOGRAM COMPLETE
Area-P 1/2: 5.02 cm2
S' Lateral: 3.73 cm

## 2020-09-27 NOTE — Telephone Encounter (Signed)
Patient returning call to discuss Echo results  °

## 2020-10-05 ENCOUNTER — Telehealth: Payer: Self-pay | Admitting: *Deleted

## 2020-10-05 ENCOUNTER — Telehealth (HOSPITAL_COMMUNITY): Payer: Self-pay | Admitting: Legal Medicine

## 2020-10-05 NOTE — Telephone Encounter (Signed)
-----   Message from Park Liter, DPM sent at 10/04/2020  3:55 PM EST ----- He was scheduled to have surgery but had a heart attack I think he needs cardiology clearance for elective surgery. Can you let her know? ----- Message ----- From: Lanney Gins, PMAC Sent: 10/04/2020   3:39 PM EST To: Park Liter, DPM  Hey Carlet called from [facility] and stated that the patient is a mutual patient and Carlet did not see any chart notes in epic and was wandering what needed to be done and the phone number is [phone]. lisa

## 2020-10-05 NOTE — Telephone Encounter (Signed)
Called and left a message for Matthew Patel (I think that is her name) at [phone] and explained per Dr Samuella Cota and to call the office if any concerns or questions. Misty Stanley

## 2020-10-09 ENCOUNTER — Other Ambulatory Visit: Payer: Self-pay | Admitting: Cardiology

## 2020-10-10 NOTE — Telephone Encounter (Signed)
Metoprolol was never recorded however using instruction on 07/14/20 OV, I sent metoprolol Succ 25mg  qd.

## 2020-10-31 NOTE — Progress Notes (Deleted)
Cardiology Office Note:    Date:  10/31/2020   ID:  Matthew Patel, DOB 1965-11-30, MRN [id number]  PCP:  Abigail Miyamoto, MD  Cardiologist:  Norman Herrlich, MD    Referring MD: Abigail Miyamoto,*    ASSESSMENT:    No diagnosis found. PLAN:    In order of problems listed above:  1. ***   Next appointment: ***   Medication Adjustments/Labs and Tests Ordered: Current medicines are reviewed at length with the patient today.  Concerns regarding medicines are outlined above.  No orders of the defined types were placed in this encounter.  No orders of the defined types were placed in this encounter.   No chief complaint on file.   History of Present Illness:    Matthew Patel is a 55 y.o. male with a hx of CAD with non-ST elevation MI July 2021 with PCI and drug-eluting stent to the right coronary artery complicated by reperfusion mediated hypotension idioventricular rhythm requiring fluid resuscitation pressor support and IV amiodarone for heart rhythm control.  His ejection fraction time was 40 to 45% and had persistent hypotension.  Last seen 202 2022.  After the visit LP(a) level was quite low at 13.2 and repeat echocardiogram showed a marked improvement EF 55 to 60% with no evidence of segmental left ventricular dysfunction and no mitral regurgitation. Compliance with diet, lifestyle and medications: *** Past Medical History:  Diagnosis Date  . CAD in native artery 07/05/2020  . Cardiomyopathy, ischemic 07/05/2020  . GERD (gastroesophageal reflux disease)   . Migraines   . Mixed hyperlipidemia   . S/P angioplasty with stent RCA DES 07/04/20 07/05/2020  . Tobacco abuse 07/05/2020    Past Surgical History:  Procedure Laterality Date  . CORONARY/GRAFT ACUTE MI REVASCULARIZATION N/A 07/04/2020   Procedure: Coronary/Graft Acute MI Revascularization;  Surgeon: Lennette Bihari, MD;  Location: [facility] INVASIVE CV LAB;  Service: Cardiovascular;  Laterality: N/A;  . LEFT  HEART CATH AND CORONARY ANGIOGRAPHY N/A 07/04/2020   Procedure: LEFT HEART CATH AND CORONARY ANGIOGRAPHY;  Surgeon: Lennette Bihari, MD;  Location: [facility] INVASIVE CV LAB;  Service: Cardiovascular;  Laterality: N/A;    Current Medications: No outpatient medications have been marked as taking for the 11/01/20 encounter (Appointment) with Baldo Daub, MD.     Allergies:   Ranitidine   Social History   Socioeconomic History  . Marital status: Single    Spouse name: Not on file  . Number of children: Not on file  . Years of education: Not on file  . Highest education level: Not on file  Occupational History  . Not on file  Tobacco Use  . Smoking status: Current Every Day Smoker    Packs/day: 1.50    Types: Cigarettes  . Smokeless tobacco: Never Used  Vaping Use  . Vaping Use: Never used  Substance and Sexual Activity  . Alcohol use: Never  . Drug use: Never  . Sexual activity: Not on file  Other Topics Concern  . Not on file  Social History Narrative  . Not on file   Social Determinants of Health   Financial Resource Strain: Not on file  Food Insecurity: Not on file  Transportation Needs: Not on file  Physical Activity: Not on file  Stress: Not on file  Social Connections: Not on file     Family History: The patient's ***family history includes Diabetes in an other family member; Heart disease in an other family member; Hypertension in an  other family member. ROS:   Please see the history of present illness.    All other systems reviewed and are negative.  EKGs/Labs/Other Studies Reviewed:    The following studies were reviewed today:  EKG:  EKG ordered today and personally reviewed.  The ekg ordered today demonstrates ***  Recent Labs: 07/05/2020: Hemoglobin 12.9; Platelets 245 08/30/2020: ALT 29; BUN 11; Creatinine, Ser 0.82; Potassium 4.4; Sodium 144  Recent Lipid Panel    Component Value Date/Time   CHOL 90 (L) 08/30/2020 1046   TRIG 74 08/30/2020 1046    HDL 36 (L) 08/30/2020 1046   CHOLHDL 2.5 08/30/2020 1046   CHOLHDL 5.7 07/04/2020 0200   VLDL 35 07/04/2020 0200   LDLCALC 38 08/30/2020 1046    Physical Exam:    VS:  There were no vitals taken for this visit.    Wt Readings from Last 3 Encounters:  08/30/20 153 lb (69.4 kg)  07/14/20 149 lb (67.6 kg)  07/04/20 155 lb 3.3 oz (70.4 kg)     GEN: *** Well nourished, well developed in no acute distress HEENT: Normal NECK: No JVD; No carotid bruits LYMPHATICS: No lymphadenopathy CARDIAC: ***RRR, no murmurs, rubs, gallops RESPIRATORY:  Clear to auscultation without rales, wheezing or rhonchi  ABDOMEN: Soft, non-tender, non-distended MUSCULOSKELETAL:  No edema; No deformity  SKIN: Warm and dry NEUROLOGIC:  Alert and oriented x 3 PSYCHIATRIC:  Normal affect    Signed, Norman Herrlich, MD  10/31/2020 12:34 PM    [facility]

## 2020-11-01 ENCOUNTER — Encounter: Payer: Self-pay | Admitting: Cardiology

## 2020-11-01 ENCOUNTER — Other Ambulatory Visit: Payer: Self-pay

## 2020-11-01 ENCOUNTER — Ambulatory Visit: Payer: Managed Care, Other (non HMO) | Admitting: Cardiology

## 2020-11-01 VITALS — BP 110/78 | HR 70 | Ht 68.0 in | Wt 154.8 lb

## 2020-11-01 DIAGNOSIS — E782 Mixed hyperlipidemia: Secondary | ICD-10-CM

## 2020-11-01 DIAGNOSIS — I2510 Atherosclerotic heart disease of native coronary artery without angina pectoris: Secondary | ICD-10-CM | POA: Diagnosis not present

## 2020-11-01 DIAGNOSIS — Z720 Tobacco use: Secondary | ICD-10-CM | POA: Diagnosis not present

## 2020-11-01 DIAGNOSIS — R5381 Other malaise: Secondary | ICD-10-CM | POA: Diagnosis not present

## 2020-11-01 MED ORDER — ATORVASTATIN CALCIUM 40 MG PO TABS: 40.0000 mg | ORAL_TABLET | Freq: Every day | ORAL | 3 refills | Status: DC

## 2020-11-01 MED ORDER — METOPROLOL SUCCINATE ER 25 MG PO TB24: 12.5000 mg | ORAL_TABLET | Freq: Every day | ORAL | 3 refills | Status: DC

## 2020-11-01 NOTE — Progress Notes (Signed)
Cardiology Office Note:    Date:  11/01/2020   ID:  Matthew Patel, DOB 29-Feb-1966, MRN [id number]  PCP:  Abigail Miyamoto, MD  Cardiologist:  Norman Herrlich, MD    Referring MD: Abigail Miyamoto,*    ASSESSMENT:    1. Malaise   2. CAD in native artery   3. Mixed hyperlipidemia   4. Tobacco abuse    PLAN:    In order of problems listed above:  1. Despite a full and complete recovery of ejection fraction and normal exercise tolerance he complains of a combination of muscle weakness and fatigue I will reduce the dose of both beta-blocker and statin by 50% of persistent stop his beta-blocker I am just uncertain if this is malaise or statin side effects.  He will continue his dual antiplatelet therapy for 1 year along with his lipid-lowering.  Fortunately he no longer smokes.   Next appointment: 3 months   Medication Adjustments/Labs and Tests Ordered: Current medicines are reviewed at length with the patient today.  Concerns regarding medicines are outlined above.  No orders of the defined types were placed in this encounter.  No orders of the defined types were placed in this encounter.   Chief Complaint  Patient presents with  . Follow-up  . Coronary Artery Disease    History of Present Illness:    Matthew Patel is a 55 y.o. male with a hx of CAD with ST elevation MI and PCI and drug-eluting stent right coronary artery 01/2020.  His course was complicated by reperfusion mediated hypotension and idioventricular rhythm requiring fluid resuscitation IV pressors Levophed and IV amiodarone for heart rhythm control.  High-sensitivity troponin was greater than 27,000 as echocardiogram showed ejection fraction of 40 to 45%.  He was placed on guideline directed therapy carvedilol and ARB and subsequently developed symptomatic hypotension.  He was last seen 07/14/2020 I stopped a vasodilator transition him to Toprol-XL arrangements for follow-up in the office and plan to  check LP(a) with his next laboratory test.  Noteworthy is he is not smoking.  He was last seen 08/30/2020.  Compliance with diet, lifestyle and medications: Yes  He is seen along with his significant other. He is returned to work full-time he seems to have made a full recovery no edema shortness of breath chest pain palpitations syncope no breathlessness with ticagrelor and tolerates his statin. He complains of weakness and malaise I cannot sort out whether this is a statin side effect or beta-blocker we will decrease the dose of each by 50% and if it persist in 2 weeks he will contact me and I will stop his beta-blocker. He asked me regarding his other mildest coronary stenoses and I told him he is stable and does not need further revascularization. Past Medical History:  Diagnosis Date  . CAD in native artery 07/05/2020  . Cardiomyopathy, ischemic 07/05/2020  . GERD (gastroesophageal reflux disease)   . Migraines   . Mixed hyperlipidemia   . S/P angioplasty with stent RCA DES 07/04/20 07/05/2020  . Tobacco abuse 07/05/2020    Past Surgical History:  Procedure Laterality Date  . CORONARY/GRAFT ACUTE MI REVASCULARIZATION N/A 07/04/2020   Procedure: Coronary/Graft Acute MI Revascularization;  Surgeon: Lennette Bihari, MD;  Location: [facility] INVASIVE CV LAB;  Service: Cardiovascular;  Laterality: N/A;  . LEFT HEART CATH AND CORONARY ANGIOGRAPHY N/A 07/04/2020   Procedure: LEFT HEART CATH AND CORONARY ANGIOGRAPHY;  Surgeon: Lennette Bihari, MD;  Location: [facility] INVASIVE CV LAB;  Service: Cardiovascular;  Laterality: N/A;    Current Medications: Current Meds  Medication Sig  . acetaminophen (TYLENOL) 325 MG tablet Take 2 tablets (650 mg total) by mouth every 4 (four) hours as needed for headache or mild pain.  Marland Kitchen aspirin 81 MG chewable tablet Chew 1 tablet (81 mg total) by mouth daily.  . COSENTYX SENSOREADY, 300 MG, 150 MG/ML SOAJ Inject 2 Syringes into the skin every 28 (twenty-eight) days.  .  fluticasone (FLONASE) 50 MCG/ACT nasal spray Place 1-2 sprays into both nostrils daily as needed for allergies or rhinitis.  . Menthol, Topical Analgesic, (MINERAL ICE EX) Apply 1 application topically as needed (foot pain).  . nitroGLYCERIN (NITROSTAT) 0.4 MG SL tablet Place 1 tablet (0.4 mg total) under the tongue every 5 (five) minutes as needed for chest pain.  Marland Kitchen omeprazole (PRILOSEC) 40 MG capsule TAKE 1 CAPSULE (40 MG TOTAL) BY MOUTH 2 (TWO) TIMES DAILY BEFORE A MEAL.  Marland Kitchen ticagrelor (BRILINTA) 90 MG TABS tablet Take 1 tablet (90 mg total) by mouth 2 (two) times daily.  . [DISCONTINUED] atorvastatin (LIPITOR) 80 MG tablet Take 1 tablet (80 mg total) by mouth daily.  . [DISCONTINUED] metoprolol succinate (TOPROL-XL) 25 MG 24 hr tablet TAKE 1 TABLET (25 MG TOTAL) BY MOUTH DAILY.     Allergies:   Ranitidine   Social History   Socioeconomic History  . Marital status: Single    Spouse name: Not on file  . Number of children: Not on file  . Years of education: Not on file  . Highest education level: Not on file  Occupational History  . Not on file  Tobacco Use  . Smoking status: Current Every Day Smoker    Packs/day: 1.50    Types: Cigarettes  . Smokeless tobacco: Never Used  Vaping Use  . Vaping Use: Never used  Substance and Sexual Activity  . Alcohol use: Never  . Drug use: Never  . Sexual activity: Not on file  Other Topics Concern  . Not on file  Social History Narrative  . Not on file   Social Determinants of Health   Financial Resource Strain: Not on file  Food Insecurity: Not on file  Transportation Needs: Not on file  Physical Activity: Not on file  Stress: Not on file  Social Connections: Not on file     Family History: The patient's family history includes Diabetes in an other family member; Heart disease in an other family member; Hypertension in an other family member. ROS:   Please see the history of present illness.    All other systems reviewed and are  negative.  EKGs/Labs/Other Studies Reviewed:    The following studies were reviewed today:   Recent Labs: 07/05/2020: Hemoglobin 12.9; Platelets 245 08/30/2020: ALT 29; BUN 11; Creatinine, Ser 0.82; Potassium 4.4; Sodium 144  Recent Lipid Panel    Component Value Date/Time   CHOL 90 (L) 08/30/2020 1046   TRIG 74 08/30/2020 1046   HDL 36 (L) 08/30/2020 1046   CHOLHDL 2.5 08/30/2020 1046   CHOLHDL 5.7 07/04/2020 0200   VLDL 35 07/04/2020 0200   LDLCALC 38 08/30/2020 1046    Physical Exam:    VS:  BP 110/78   Pulse 70   Ht 5\' 8"  (1.727 m)   Wt 154 lb 12.8 oz (70.2 kg)   SpO2 96%   BMI 23.54 kg/m     Wt Readings from Last 3 Encounters:  11/01/20 154 lb 12.8 oz (70.2 kg)  08/30/20 153  lb (69.4 kg)  07/14/20 149 lb (67.6 kg)     GEN:  Well nourished, well developed in no acute distress HEENT: Normal NECK: No JVD; No carotid bruits LYMPHATICS: No lymphadenopathy CARDIAC: RRR, no murmurs, rubs, gallops RESPIRATORY:  Clear to auscultation without rales, wheezing or rhonchi  ABDOMEN: Soft, non-tender, non-distended MUSCULOSKELETAL:  No edema; No deformity  SKIN: Warm and dry NEUROLOGIC:  Alert and oriented x 3 PSYCHIATRIC:  Normal affect    Signed, Norman Herrlich, MD  11/01/2020 3:16 PM    [facility]

## 2020-11-01 NOTE — Patient Instructions (Signed)
Medication Instructions:  Your physician has recommended you make the following change in your medication:  DECREASE: TOPROL XL 12.5 mg take 1/2 tablet by mouth daily.  DECREASE: Atorvastatin 40 mg take one tablet by mouth daily.  *If you need a refill on your cardiac medications before your next appointment, please call your pharmacy*   Lab Work: None If you have labs (blood work) drawn today and your tests are completely normal, you will receive your results only by: Marland Kitchen MyChart Message (if you have MyChart) OR . A paper copy in the mail If you have any lab test that is abnormal or we need to change your treatment, we will call you to review the results.   Testing/Procedures: None   Follow-Up: At [facility], you and your health needs are our priority.  As part of our continuing mission to provide you with exceptional heart care, we have created designated Provider Care Teams.  These Care Teams include your primary Cardiologist (physician) and Advanced Practice Providers (APPs -  Physician Assistants and Nurse Practitioners) who all work together to provide you with the care you need, when you need it.  We recommend signing up for the patient portal called "MyChart".  Sign up information is provided on this After Visit Summary.  MyChart is used to connect with patients for Virtual Visits (Telemedicine).  Patients are able to view lab/test results, encounter notes, upcoming appointments, etc.  Non-urgent messages can be sent to your provider as well.   To learn more about what you can do with MyChart, go to [email or web address].    Your next appointment:   3 month(s)  The format for your next appointment:   In Person  Provider:   Norman Herrlich, MD   Other Instructions

## 2020-11-27 ENCOUNTER — Ambulatory Visit: Payer: Managed Care, Other (non HMO) | Admitting: Legal Medicine

## 2020-11-28 ENCOUNTER — Ambulatory Visit: Payer: Managed Care, Other (non HMO) | Admitting: Legal Medicine

## 2020-11-30 ENCOUNTER — Ambulatory Visit: Payer: Managed Care, Other (non HMO) | Admitting: Legal Medicine

## 2020-11-30 ENCOUNTER — Encounter: Payer: Self-pay | Admitting: Legal Medicine

## 2020-11-30 ENCOUNTER — Other Ambulatory Visit: Payer: Self-pay

## 2020-11-30 VITALS — BP 126/74 | HR 79 | Resp 18 | Ht 67.0 in | Wt 155.8 lb

## 2020-11-30 DIAGNOSIS — B070 Plantar wart: Secondary | ICD-10-CM

## 2020-11-30 DIAGNOSIS — M722 Plantar fascial fibromatosis: Secondary | ICD-10-CM | POA: Diagnosis not present

## 2020-11-30 DIAGNOSIS — J410 Simple chronic bronchitis: Secondary | ICD-10-CM

## 2020-11-30 DIAGNOSIS — J301 Allergic rhinitis due to pollen: Secondary | ICD-10-CM | POA: Diagnosis not present

## 2020-11-30 MED ORDER — TRIAMCINOLONE ACETONIDE 40 MG/ML IJ SUSP
80.0000 mg | Freq: Once | INTRAMUSCULAR | Status: AC
  Administered 2020-11-30: 80 mg via INTRAMUSCULAR

## 2020-11-30 NOTE — Progress Notes (Signed)
Subjective:  Patient ID: Matthew Patel, male    DOB: 1965-10-03  Age: 55 y.o. MRN: [id number]  Chief Complaint  Patient presents with  . Foot Injury  . Nasal Congestion  . chest congestion    HPI: plantar fasciitis for years, sees podiatrist for plantar wart with freezing.  He has tight hamstrings and planned for surgery but patient had MI. He is stretching his achillis tendon and plantar fascia, 5 shots to plantar fascia no help.  Patient is having allergic rhinitis.   And request kenalog injection. Current Outpatient Medications on File Prior to Visit  Medication Sig Dispense Refill  . acetaminophen (TYLENOL) 325 MG tablet Take 2 tablets (650 mg total) by mouth every 4 (four) hours as needed for headache or mild pain.    Marland Kitchen aspirin 81 MG chewable tablet Chew 1 tablet (81 mg total) by mouth daily.    Marland Kitchen atorvastatin (LIPITOR) 40 MG tablet Take 1 tablet (40 mg total) by mouth daily. 90 tablet 3  . COSENTYX SENSOREADY, 300 MG, 150 MG/ML SOAJ Inject 2 Syringes into the skin every 28 (twenty-eight) days.    . fluticasone (FLONASE) 50 MCG/ACT nasal spray Place 1-2 sprays into both nostrils daily as needed for allergies or rhinitis.    . Menthol, Topical Analgesic, (MINERAL ICE EX) Apply 1 application topically as needed (foot pain).    . metoprolol succinate (TOPROL XL) 25 MG 24 hr tablet Take 0.5 tablets (12.5 mg total) by mouth daily. 45 tablet 3  . nitroGLYCERIN (NITROSTAT) 0.4 MG SL tablet Place 1 tablet (0.4 mg total) under the tongue every 5 (five) minutes as needed for chest pain. 25 tablet 4  . omeprazole (PRILOSEC) 40 MG capsule TAKE 1 CAPSULE (40 MG TOTAL) BY MOUTH 2 (TWO) TIMES DAILY BEFORE A MEAL. 60 capsule 2  . ticagrelor (BRILINTA) 90 MG TABS tablet Take 1 tablet (90 mg total) by mouth 2 (two) times daily. 60 tablet 11   No current facility-administered medications on file prior to visit.   Past Medical History:  Diagnosis Date  . CAD in native artery 07/05/2020  .  Cardiomyopathy, ischemic 07/05/2020  . GERD (gastroesophageal reflux disease)   . Migraines   . Mixed hyperlipidemia   . S/P angioplasty with stent RCA DES 07/04/20 07/05/2020  . Tobacco abuse 07/05/2020   Past Surgical History:  Procedure Laterality Date  . CORONARY/GRAFT ACUTE MI REVASCULARIZATION N/A 07/04/2020   Procedure: Coronary/Graft Acute MI Revascularization;  Surgeon: Lennette Bihari, MD;  Location[facility] INVASIVE CV LAB;  Service: Cardiovascular;  Laterality: N/A;  . LEFT HEART CATH AND CORONARY ANGIOGRAPHY N/A 07/04/2020   Procedure: LEFT HEART CATH AND CORONARY ANGIOGRAPHY;  Surgeon: Lennette Bihari, MD;  Location: [facility] INVASIVE CV LAB;  Service: Cardiovascular;  Laterality: N/A;    Family History  Problem Relation Age of Onset  . Diabetes Other   . Hypertension Other   . Heart disease Other    Social History   Socioeconomic History  . Marital status: Single    Spouse name: Not on file  . Number of children: Not on file  . Years of education: Not on file  . Highest education level: Not on file  Occupational History  . Not on file  Tobacco Use  . Smoking status: Current Every Day Smoker    Packs/day: 1.50    Types: Cigarettes  . Smokeless tobacco: Never Used  Vaping Use  . Vaping Use: Never used  Substance and Sexual Activity  .  Alcohol use: Never  . Drug use: Never  . Sexual activity: Not on file  Other Topics Concern  . Not on file  Social History Narrative  . Not on file   Social Determinants of Health   Financial Resource Strain: Not on file  Food Insecurity: Not on file  Transportation Needs: Not on file  Physical Activity: Not on file  Stress: Not on file  Social Connections: Not on file    Review of Systems  Constitutional: Negative for activity change and appetite change.  HENT: Positive for congestion and rhinorrhea.   Respiratory: Negative for chest tightness and shortness of breath.   Cardiovascular: Negative for chest pain and leg swelling.   Gastrointestinal: Negative for abdominal distention and abdominal pain.  Genitourinary: Negative.   Musculoskeletal:       Foot pain  Skin: Negative.   Neurological: Negative.   Psychiatric/Behavioral: Negative.      Objective:  BP 126/74   Pulse 79   Resp 18   Ht 5\' 7"  (1.702 m)   Wt 155 lb 12.8 oz (70.7 kg)   SpO2 97%   BMI 24.40 kg/m   BP/Weight 11/30/2020 11/01/2020 08/30/2020  Systolic BP 126 110 128  Diastolic BP 74 78 84  Wt. (Lbs) 155.8 154.8 153  BMI 24.4 23.54 23.26    Physical Exam Vitals reviewed.  Constitutional:      Appearance: Normal appearance. He is normal weight.  HENT:     Right Ear: Tympanic membrane, ear canal and external ear normal.     Left Ear: Tympanic membrane, ear canal and external ear normal.     Mouth/Throat:     Mouth: Mucous membranes are moist.     Pharynx: Oropharynx is clear.  Eyes:     Extraocular Movements: Extraocular movements intact.     Conjunctiva/sclera: Conjunctivae normal.     Pupils: Pupils are equal, round, and reactive to light.  Cardiovascular:     Pulses: Normal pulses.     Heart sounds: No murmur heard. No gallop.   Pulmonary:     Effort: Pulmonary effort is normal.  Abdominal:     General: Abdomen is flat. Bowel sounds are normal. There is no distension.     Tenderness: There is no abdominal tenderness.  Musculoskeletal:        General: Tenderness present.     Cervical back: Normal range of motion.     Comments: Plantar wart left foot frozen x 2 He has pain in plantar fascia and very tight achillis tendon.  Neurological:     General: No focal deficit present.     Mental Status: He is alert. Mental status is at baseline.       Lab Results  Component Value Date   WBC 11.5 (H) 07/05/2020   HGB 12.9 (L) 07/05/2020   HCT 37.8 (L) 07/05/2020   PLT 245 07/05/2020   GLUCOSE 102 (H) 08/30/2020   CHOL 90 (L) 08/30/2020   TRIG 74 08/30/2020   HDL 36 (L) 08/30/2020   LDLCALC 38 08/30/2020   ALT 29  08/30/2020   AST 12 08/30/2020   NA 144 08/30/2020   K 4.4 08/30/2020   CL 104 08/30/2020   CREATININE 0.82 08/30/2020   BUN 11 08/30/2020   CO2 27 08/30/2020   INR 1.0 07/04/2020   HGBA1C 5.8 (H) 07/04/2020      Assessment & Plan:    Diagnoses and all orders for this visit: Plantar fasciitis of left foot Patient has chronic  plantar fasciitis and has seen podiatry for this has had a total of 5 injections as well as shoe inserts which has not helped him much.  They were planning on surgery but he had an MRI recently so it was put off.  Simple chronic bronchitis (HCC) Patient has simple chronic bronchitis and is presently not on medication.  Plantar wart Patient has plantar wart in the posterior aspect of his plantar surface this was frozen twice with cryotherapy.  No complications.  Seasonal allergic rhinitis due to pollen -     triamcinolone acetonide (KENALOG-40) injection 80 mg  Patient is having seasonal allergies and request a Kenalog injection for this.       Follow-up: Return if symptoms worsen or fail to improve.  An After Visit Summary was printed and given to the patient.  Brent Bulla, MD [facility] [phone]

## 2021-01-30 NOTE — Progress Notes (Signed)
Cardiology Office Note:    Date:  01/31/2021   ID:  Matthew Patel, DOB 04/30/1966, MRN [id number]  PCP:  Abigail Miyamoto, MD  Cardiologist:  Norman Herrlich, MD    Referring MD: Abigail Miyamoto,*    ASSESSMENT:    1. CAD in native artery   2. Cardiomyopathy, ischemic   3. Hypotension due to drugs   4. Mixed hyperlipidemia    PLAN:    In order of problems listed above:  He is having recurrent symptoms possible/probable angina now more than a year remote from PCI complex right coronary lesion with no reflow and has done well until the last month.  Ventricular function has normalized.  I think in this situation is best served by referral to coronary angiography if he does not have obstructive CAD I think he should stop Brilinta and I will transition to long-term clopidogrel therapy along with his aspirin. Improved ejection fraction normalized on follow-up echocardiogram continue guideline therapy including a small dose of beta-blocker poorly tolerated because of weakness and underlying lung disease and he is not on Entresto previous hypotension. Continue statin therapy   Next appointment: 6 weeks   Medication Adjustments/Labs and Tests Ordered: Current medicines are reviewed at length with the patient today.  Concerns regarding medicines are outlined above.  No orders of the defined types were placed in this encounter.  No orders of the defined types were placed in this encounter.   Chief Complaint  Patient presents with   Follow-up    Recent chest pain last month   Coronary Artery Disease   Thoracic Aortic Aneurysm    History of Present Illness:    Matthew Patel is a 55 y.o. male with a hx of CAD with ST elevation MI and PCI and stent drug-eluting right coronary artery February 2021.  His course was complicated by reperfusion mediated hypotension idioventricular rhythm requiring fluid resuscitation IV pressors Levophed and IV amiodarone for heart rate  control.  His initial echocardiogram showed an EF of 40 to 45% and developed symptomatic hypotension with carvedilol and ARB.  Other problems include hyperlipidemia and malaise and he has had normalization of his ejection fraction and his beta-blocker dose was decreased when last seen 0 11/01/2020.  Compliance with diet, lifestyle and medications: Yes  This was a routine follow-up visit quickly changed. His wife is present for about a month he has been having episodes of chest discomfort he calls a pressure substernal not associated with activity is better with rest at times it will wax and wane throughout the day but particularly happens in the evening after his meal. He had similar symptoms prior to his myocardial infarction. He continues on dual antiplatelet therapy He is on a PPI for heartburn. No shortness of breath radiation of pain is not pleuritic no nausea or vomiting. Symptoms are mild to moderate he has not needed or has not taken nitroglycerin.  Echocardiogram 09/27/2020 shows EF 55 to 60% with hypokinesia of the basilar inferior segment.  IMPRESSIONS   1. Left ventricular ejection fraction, by estimation, is 55 to 60%. The  left ventricle has normal function. The left ventricle demonstrates  regional wall motion abnormalities (see scoring diagram/findings for  description). Left ventricular diastolic  parameters were normal.   2. Right ventricular systolic function is normal. The right ventricular  size is normal.   3. The mitral valve is normal in structure. No evidence of mitral valve  regurgitation. No evidence of mitral stenosis.   4.  The aortic valve is tricuspid. Aortic valve regurgitation is not  visualized. No aortic stenosis is present.   5. The inferior vena cava is normal in size with greater than 50%  respiratory variability, suggesting right atrial pressure of 3 mmHg.   Left heart cath 07/04/2020: Conclusion   Prox RCA to Mid RCA lesion is 100% stenosed. RPDA  lesion is 100% stenosed. Ost LAD to Prox LAD lesion is 20% stenosed. Prox LAD to Mid LAD lesion is 20% stenosed. Post intervention, there is a 0% residual stenosis. A stent was successfully placed.   Acute inferior ST segment elevation myocardial infarction secondary to thrombotic occlusion of the proximal to mid RCA with initial TIMI 0 flow.   Mild 20% ostial narrowing of the LAD and 20% mid LAD stenosis.   Normal left circumflex coronary artery.   Reperfusion mediated hypotension, idioventricular rhythm, requiring fluid resuscitation, IV amiodarone in addition to Levophed.   Aggrastat administration with thrombotic occlusion and probable embolized thrombus to the very distal PDA.   Successful percutaneous coronary intervention to the RCA with ultimate insertion of a 3.5 x 30 mm Resolute Onyx DES stent postdilated to 3.55 mm with the 100 % occlusion being reduced to 0% and restoration of brisk TIMI-3 flow. Past Medical History:  Diagnosis Date   CAD in native artery 07/05/2020   Cardiomyopathy, ischemic 07/05/2020   GERD (gastroesophageal reflux disease)    Migraines    Mixed hyperlipidemia    S/P angioplasty with stent RCA DES 07/04/20 07/05/2020   Tobacco abuse 07/05/2020    Past Surgical History:  Procedure Laterality Date   CORONARY/GRAFT ACUTE MI REVASCULARIZATION N/A 07/04/2020   Procedure: Coronary/Graft Acute MI Revascularization;  Surgeon: Lennette Bihari, MD;  Location: [facility] INVASIVE CV LAB;  Service: Cardiovascular;  Laterality: N/A;   LEFT HEART CATH AND CORONARY ANGIOGRAPHY N/A 07/04/2020   Procedure: LEFT HEART CATH AND CORONARY ANGIOGRAPHY;  Surgeon: Lennette Bihari, MD;  Location: [facility] INVASIVE CV LAB;  Service: Cardiovascular;  Laterality: N/A;    Current Medications: Current Meds  Medication Sig   acetaminophen (TYLENOL) 325 MG tablet Take 2 tablets (650 mg total) by mouth every 4 (four) hours as needed for headache or mild pain.   aspirin 81 MG chewable tablet Chew 1  tablet (81 mg total) by mouth daily.   atorvastatin (LIPITOR) 40 MG tablet Take 1 tablet (40 mg total) by mouth daily.   COSENTYX SENSOREADY, 300 MG, 150 MG/ML SOAJ Inject 2 Syringes into the skin every 28 (twenty-eight) days.   fluticasone (FLONASE) 50 MCG/ACT nasal spray Place 1-2 sprays into both nostrils daily as needed for allergies or rhinitis.   Menthol, Topical Analgesic, (MINERAL ICE EX) Apply 1 application topically as needed (foot pain).   metoprolol succinate (TOPROL XL) 25 MG 24 hr tablet Take 0.5 tablets (12.5 mg total) by mouth daily.   nitroGLYCERIN (NITROSTAT) 0.4 MG SL tablet Place 1 tablet (0.4 mg total) under the tongue every 5 (five) minutes as needed for chest pain.   omeprazole (PRILOSEC) 40 MG capsule TAKE 1 CAPSULE (40 MG TOTAL) BY MOUTH 2 (TWO) TIMES DAILY BEFORE A MEAL.   ticagrelor (BRILINTA) 90 MG TABS tablet Take 1 tablet (90 mg total) by mouth 2 (two) times daily.     Allergies:   Ranitidine   Social History   Socioeconomic History   Marital status: Single    Spouse name: Not on file   Number of children: Not on file   Years of education: Not  on file   Highest education level: Not on file  Occupational History   Not on file  Tobacco Use   Smoking status: Former    Packs/day: 1.50    Pack years: 0.00    Types: Cigarettes    Quit date: 07/04/2020    Years since quitting: 0.5   Smokeless tobacco: Never  Vaping Use   Vaping Use: Never used  Substance and Sexual Activity   Alcohol use: Never   Drug use: Never   Sexual activity: Not on file  Other Topics Concern   Not on file  Social History Narrative   Not on file   Social Determinants of Health   Financial Resource Strain: Not on file  Food Insecurity: Not on file  Transportation Needs: Not on file  Physical Activity: Not on file  Stress: Not on file  Social Connections: Not on file     Family History: The patient's family history includes Diabetes in an other family member; Heart disease  in an other family member; Hypertension in an other family member. ROS:   Please see the history of present illness.    All other systems reviewed and are negative.  EKGs/Labs/Other Studies Reviewed:    The following studies were reviewed today:  EKG:  EKG ordered today and personally reviewed.  The ekg ordered today demonstrates sinus rhythm and significant inferior Q waves otherwise normal EKG no ischemic changes  Recent Labs: 07/05/2020: Hemoglobin 12.9; Platelets 245 08/30/2020: ALT 29; BUN 11; Creatinine, Ser 0.82; Potassium 4.4; Sodium 144  Recent Lipid Panel    Component Value Date/Time   CHOL 90 (L) 08/30/2020 1046   TRIG 74 08/30/2020 1046   HDL 36 (L) 08/30/2020 1046   CHOLHDL 2.5 08/30/2020 1046   CHOLHDL 5.7 07/04/2020 0200   VLDL 35 07/04/2020 0200   LDLCALC 38 08/30/2020 1046    Physical Exam:    VS:  BP 110/80 (BP Location: Right Arm, Patient Position: Sitting, Cuff Size: Normal)   Pulse 64   Ht 5\' 7"  (1.702 m)   Wt 151 lb (68.5 kg)   SpO2 97%   BMI 23.65 kg/m     Wt Readings from Last 3 Encounters:  01/31/21 151 lb (68.5 kg)  11/30/20 155 lb 12.8 oz (70.7 kg)  11/01/20 154 lb 12.8 oz (70.2 kg)     GEN:  Well nourished, well developed in no acute distress HEENT: Normal NECK: No JVD; No carotid bruits LYMPHATICS: No lymphadenopathy CARDIAC: RRR, no murmurs, rubs, gallops RESPIRATORY:  Clear to auscultation without rales, wheezing or rhonchi  ABDOMEN: Soft, non-tender, non-distended MUSCULOSKELETAL:  No edema; No deformity  SKIN: Warm and dry NEUROLOGIC:  Alert and oriented x 3 PSYCHIATRIC:  Normal affect    Signed, 01/01/21, MD  01/31/2021 8:41 AM    [facility]

## 2021-01-30 NOTE — H&P (View-Only) (Signed)
Cardiology Office Note:    Date:  01/31/2021   ID:  Matthew Patel, DOB 04/30/1966, MRN [id number]  PCP:  Abigail Miyamoto, MD  Cardiologist:  Norman Herrlich, MD    Referring MD: Abigail Miyamoto,*    ASSESSMENT:    1. CAD in native artery   2. Cardiomyopathy, ischemic   3. Hypotension due to drugs   4. Mixed hyperlipidemia    PLAN:    In order of problems listed above:  He is having recurrent symptoms possible/probable angina now more than a year remote from PCI complex right coronary lesion with no reflow and has done well until the last month.  Ventricular function has normalized.  I think in this situation is best served by referral to coronary angiography if he does not have obstructive CAD I think he should stop Brilinta and I will transition to long-term clopidogrel therapy along with his aspirin. Improved ejection fraction normalized on follow-up echocardiogram continue guideline therapy including a small dose of beta-blocker poorly tolerated because of weakness and underlying lung disease and he is not on Entresto previous hypotension. Continue statin therapy   Next appointment: 6 weeks   Medication Adjustments/Labs and Tests Ordered: Current medicines are reviewed at length with the patient today.  Concerns regarding medicines are outlined above.  No orders of the defined types were placed in this encounter.  No orders of the defined types were placed in this encounter.   Chief Complaint  Patient presents with   Follow-up    Recent chest pain last month   Coronary Artery Disease   Thoracic Aortic Aneurysm    History of Present Illness:    Matthew Patel is a 55 y.o. male with a hx of CAD with ST elevation MI and PCI and stent drug-eluting right coronary artery February 2021.  His course was complicated by reperfusion mediated hypotension idioventricular rhythm requiring fluid resuscitation IV pressors Levophed and IV amiodarone for heart rate  control.  His initial echocardiogram showed an EF of 40 to 45% and developed symptomatic hypotension with carvedilol and ARB.  Other problems include hyperlipidemia and malaise and he has had normalization of his ejection fraction and his beta-blocker dose was decreased when last seen 0 11/01/2020.  Compliance with diet, lifestyle and medications: Yes  This was a routine follow-up visit quickly changed. His wife is present for about a month he has been having episodes of chest discomfort he calls a pressure substernal not associated with activity is better with rest at times it will wax and wane throughout the day but particularly happens in the evening after his meal. He had similar symptoms prior to his myocardial infarction. He continues on dual antiplatelet therapy He is on a PPI for heartburn. No shortness of breath radiation of pain is not pleuritic no nausea or vomiting. Symptoms are mild to moderate he has not needed or has not taken nitroglycerin.  Echocardiogram 09/27/2020 shows EF 55 to 60% with hypokinesia of the basilar inferior segment.  IMPRESSIONS   1. Left ventricular ejection fraction, by estimation, is 55 to 60%. The  left ventricle has normal function. The left ventricle demonstrates  regional wall motion abnormalities (see scoring diagram/findings for  description). Left ventricular diastolic  parameters were normal.   2. Right ventricular systolic function is normal. The right ventricular  size is normal.   3. The mitral valve is normal in structure. No evidence of mitral valve  regurgitation. No evidence of mitral stenosis.   4.  The aortic valve is tricuspid. Aortic valve regurgitation is not  visualized. No aortic stenosis is present.   5. The inferior vena cava is normal in size with greater than 50%  respiratory variability, suggesting right atrial pressure of 3 mmHg.   Left heart cath 07/04/2020: Conclusion   Prox RCA to Mid RCA lesion is 100% stenosed. RPDA  lesion is 100% stenosed. Ost LAD to Prox LAD lesion is 20% stenosed. Prox LAD to Mid LAD lesion is 20% stenosed. Post intervention, there is a 0% residual stenosis. A stent was successfully placed.   Acute inferior ST segment elevation myocardial infarction secondary to thrombotic occlusion of the proximal to mid RCA with initial TIMI 0 flow.   Mild 20% ostial narrowing of the LAD and 20% mid LAD stenosis.   Normal left circumflex coronary artery.   Reperfusion mediated hypotension, idioventricular rhythm, requiring fluid resuscitation, IV amiodarone in addition to Levophed.   Aggrastat administration with thrombotic occlusion and probable embolized thrombus to the very distal PDA.   Successful percutaneous coronary intervention to the RCA with ultimate insertion of a 3.5 x 30 mm Resolute Onyx DES stent postdilated to 3.55 mm with the 100 % occlusion being reduced to 0% and restoration of brisk TIMI-3 flow. Past Medical History:  Diagnosis Date   CAD in native artery 07/05/2020   Cardiomyopathy, ischemic 07/05/2020   GERD (gastroesophageal reflux disease)    Migraines    Mixed hyperlipidemia    S/P angioplasty with stent RCA DES 07/04/20 07/05/2020   Tobacco abuse 07/05/2020    Past Surgical History:  Procedure Laterality Date   CORONARY/GRAFT ACUTE MI REVASCULARIZATION N/A 07/04/2020   Procedure: Coronary/Graft Acute MI Revascularization;  Surgeon: Lennette Bihari, MD;  Location: [facility] INVASIVE CV LAB;  Service: Cardiovascular;  Laterality: N/A;   LEFT HEART CATH AND CORONARY ANGIOGRAPHY N/A 07/04/2020   Procedure: LEFT HEART CATH AND CORONARY ANGIOGRAPHY;  Surgeon: Lennette Bihari, MD;  Location: [facility] INVASIVE CV LAB;  Service: Cardiovascular;  Laterality: N/A;    Current Medications: Current Meds  Medication Sig   acetaminophen (TYLENOL) 325 MG tablet Take 2 tablets (650 mg total) by mouth every 4 (four) hours as needed for headache or mild pain.   aspirin 81 MG chewable tablet Chew 1  tablet (81 mg total) by mouth daily.   atorvastatin (LIPITOR) 40 MG tablet Take 1 tablet (40 mg total) by mouth daily.   COSENTYX SENSOREADY, 300 MG, 150 MG/ML SOAJ Inject 2 Syringes into the skin every 28 (twenty-eight) days.   fluticasone (FLONASE) 50 MCG/ACT nasal spray Place 1-2 sprays into both nostrils daily as needed for allergies or rhinitis.   Menthol, Topical Analgesic, (MINERAL ICE EX) Apply 1 application topically as needed (foot pain).   metoprolol succinate (TOPROL XL) 25 MG 24 hr tablet Take 0.5 tablets (12.5 mg total) by mouth daily.   nitroGLYCERIN (NITROSTAT) 0.4 MG SL tablet Place 1 tablet (0.4 mg total) under the tongue every 5 (five) minutes as needed for chest pain.   omeprazole (PRILOSEC) 40 MG capsule TAKE 1 CAPSULE (40 MG TOTAL) BY MOUTH 2 (TWO) TIMES DAILY BEFORE A MEAL.   ticagrelor (BRILINTA) 90 MG TABS tablet Take 1 tablet (90 mg total) by mouth 2 (two) times daily.     Allergies:   Ranitidine   Social History   Socioeconomic History   Marital status: Single    Spouse name: Not on file   Number of children: Not on file   Years of education: Not  on file   Highest education level: Not on file  Occupational History   Not on file  Tobacco Use   Smoking status: Former    Packs/day: 1.50    Pack years: 0.00    Types: Cigarettes    Quit date: 07/04/2020    Years since quitting: 0.5   Smokeless tobacco: Never  Vaping Use   Vaping Use: Never used  Substance and Sexual Activity   Alcohol use: Never   Drug use: Never   Sexual activity: Not on file  Other Topics Concern   Not on file  Social History Narrative   Not on file   Social Determinants of Health   Financial Resource Strain: Not on file  Food Insecurity: Not on file  Transportation Needs: Not on file  Physical Activity: Not on file  Stress: Not on file  Social Connections: Not on file     Family History: The patient's family history includes Diabetes in an other family member; Heart disease  in an other family member; Hypertension in an other family member. ROS:   Please see the history of present illness.    All other systems reviewed and are negative.  EKGs/Labs/Other Studies Reviewed:    The following studies were reviewed today:  EKG:  EKG ordered today and personally reviewed.  The ekg ordered today demonstrates sinus rhythm and significant inferior Q waves otherwise normal EKG no ischemic changes  Recent Labs: 07/05/2020: Hemoglobin 12.9; Platelets 245 08/30/2020: ALT 29; BUN 11; Creatinine, Ser 0.82; Potassium 4.4; Sodium 144  Recent Lipid Panel    Component Value Date/Time   CHOL 90 (L) 08/30/2020 1046   TRIG 74 08/30/2020 1046   HDL 36 (L) 08/30/2020 1046   CHOLHDL 2.5 08/30/2020 1046   CHOLHDL 5.7 07/04/2020 0200   VLDL 35 07/04/2020 0200   LDLCALC 38 08/30/2020 1046    Physical Exam:    VS:  BP 110/80 (BP Location: Right Arm, Patient Position: Sitting, Cuff Size: Normal)   Pulse 64   Ht 5\' 7"  (1.702 m)   Wt 151 lb (68.5 kg)   SpO2 97%   BMI 23.65 kg/m     Wt Readings from Last 3 Encounters:  01/31/21 151 lb (68.5 kg)  11/30/20 155 lb 12.8 oz (70.7 kg)  11/01/20 154 lb 12.8 oz (70.2 kg)     GEN:  Well nourished, well developed in no acute distress HEENT: Normal NECK: No JVD; No carotid bruits LYMPHATICS: No lymphadenopathy CARDIAC: RRR, no murmurs, rubs, gallops RESPIRATORY:  Clear to auscultation without rales, wheezing or rhonchi  ABDOMEN: Soft, non-tender, non-distended MUSCULOSKELETAL:  No edema; No deformity  SKIN: Warm and dry NEUROLOGIC:  Alert and oriented x 3 PSYCHIATRIC:  Normal affect    Signed, 01/01/21, MD  01/31/2021 8:41 AM    [facility]

## 2021-01-31 ENCOUNTER — Ambulatory Visit: Payer: Managed Care, Other (non HMO) | Admitting: Cardiology

## 2021-01-31 ENCOUNTER — Other Ambulatory Visit: Payer: Self-pay

## 2021-01-31 ENCOUNTER — Encounter: Payer: Self-pay | Admitting: Cardiology

## 2021-01-31 VITALS — BP 110/80 | HR 64 | Ht 67.0 in | Wt 151.0 lb

## 2021-01-31 DIAGNOSIS — I2510 Atherosclerotic heart disease of native coronary artery without angina pectoris: Secondary | ICD-10-CM | POA: Diagnosis not present

## 2021-01-31 DIAGNOSIS — I255 Ischemic cardiomyopathy: Secondary | ICD-10-CM | POA: Diagnosis not present

## 2021-01-31 DIAGNOSIS — E782 Mixed hyperlipidemia: Secondary | ICD-10-CM | POA: Diagnosis not present

## 2021-01-31 DIAGNOSIS — I952 Hypotension due to drugs: Secondary | ICD-10-CM | POA: Diagnosis not present

## 2021-01-31 MED ORDER — SODIUM CHLORIDE 0.9% FLUSH: 3.0000 mL | Freq: Two times a day (BID) | INTRAVENOUS | Status: AC

## 2021-01-31 NOTE — Patient Instructions (Signed)
Medication Instructions:  Your physician recommends that you continue on your current medications as directed. Please refer to the Current Medication list given to you today.  *If you need a refill on your cardiac medications before your next appointment, please call your pharmacy*   Lab Work: Your physician recommends that you return for lab work in: TODAY BMP, CBC If you have labs (blood work) drawn today and your tests are completely normal, you will receive your results only by: MyChart Message (if you have MyChart) OR A paper copy in the mail If you have any lab test that is abnormal or we need to change your treatment, we will call you to review the results.   Testing/Procedures:    [facility]OVASC[facility] AT [place] [address] [place] [address] Dept: [phone] Loc: [phone]  Chesky Heyer Castles  01/31/2021  You are scheduled for a Cardiac Catheterization on Friday, July 8 with Dr. Lorine Bears.  1. Please arrive at the [facility] (Main Entrance A) at [facility]: [address] [place], [place] [address] at 5:30 AM (This time is two hours before your procedure to ensure your preparation). Free valet parking service is available.   Special note: Every effort is made to have your procedure done on time. Please understand that emergencies sometimes delay scheduled procedures.  2. Diet: Do not eat solid foods after midnight.  The patient may have clear liquids until 5am upon the day of the procedure.  3. Labs: You had your labs drawn today 01/31/2021  4. Medication instructions in preparation for your procedure:   Contrast Allergy: No   On the morning of your procedure, take your Aspirin and any morning medicines NOT listed above.  You may use sips of water.  5. Plan for one night stay--bring personal belongings. 6. Bring a current list of your medications and current insurance cards. 7. You  MUST have a responsible person to drive you home. 8. Someone MUST be with you the first 24 hours after you arrive home or your discharge will be delayed. 9. Please wear clothes that are easy to get on and off and wear slip-on shoes.  Thank you for allowing [place] to care for you!   -- [facility]    Follow-Up: At [facility], you and your health needs are our priority.  As part of our continuing mission to provide you with exceptional heart care, we have created designated Provider Care Teams.  These Care Teams include your primary Cardiologist (physician) and Advanced Practice Providers (APPs -  Physician Assistants and Nurse Practitioners) who all work together to provide you with the care you need, when you need it.  We recommend signing up for the patient portal called "MyChart".  Sign up information is provided on this After Visit Summary.  MyChart is used to connect with patients for Virtual Visits (Telemedicine).  Patients are able to view lab/test results, encounter notes, upcoming appointments, etc.  Non-urgent messages can be sent to your provider as well.   To learn more about what you can do with MyChart, go to [email or web address].    Your next appointment:   6 week(s)  The format for your next appointment:   In Person  Provider:   Norman Herrlich, MD   Other Instructions

## 2021-02-01 ENCOUNTER — Telehealth: Payer: Self-pay

## 2021-02-01 ENCOUNTER — Telehealth: Payer: Self-pay | Admitting: *Deleted

## 2021-02-01 LAB — BASIC METABOLIC PANEL
BUN/Creatinine Ratio: 13 (ref 9–20)
BUN: 11 mg/dL (ref 6–24)
CO2: 25 mmol/L (ref 20–29)
Calcium: 9.9 mg/dL (ref 8.7–10.2)
Chloride: 103 mmol/L (ref 96–106)
Creatinine, Ser: 0.84 mg/dL (ref 0.76–1.27)
Glucose: 111 mg/dL — ABNORMAL HIGH (ref 65–99)
Potassium: 4.3 mmol/L (ref 3.5–5.2)
Sodium: 143 mmol/L (ref 134–144)
eGFR: 105 mL/min/{1.73_m2} (ref >59)

## 2021-02-01 LAB — CBC
Hematocrit: 42.5 % (ref 37.5–51.0)
Hemoglobin: 14.2 g/dL (ref 13.0–17.7)
MCH: 30.8 pg (ref 26.6–33.0)
MCHC: 33.4 g/dL (ref 31.5–35.7)
MCV: 92 fL (ref 79–97)
Platelets: 250 10*3/uL (ref 150–450)
RBC: 4.61 x10E6/uL (ref 4.14–5.80)
RDW: 13.1 % (ref 11.6–15.4)
WBC: 7.6 10*3/uL (ref 3.4–10.8)

## 2021-02-01 NOTE — Telephone Encounter (Signed)
-----   Message from Baldo Daub, MD sent at 02/01/2021  7:31 AM EDT ----- Normal or stable result  For cardiac cath

## 2021-02-01 NOTE — Telephone Encounter (Signed)
Spoke with patient regarding results and recommendation.  Patient verbalizes understanding and is agreeable to plan of care. Advised patient to call back with any issues or concerns.  

## 2021-02-01 NOTE — Telephone Encounter (Signed)
Pt contacted pre-catheterization scheduled at [place] for: Friday February 02, 2021 7:30 AM Verified arrival time and place: [facility] Main Entrance A ([place] [facility]) at: 5:30 AM   No solid food after midnight prior to cath, clear liquids until 5 AM day of procedure.   AM meds can be  taken pre-cath with sips of water including: aspirin 81 mg   Confirmed patient has responsible adult to drive home post procedure and be with patient first 24 hours after arriving home: yes  You are allowed ONE visitor in the waiting room during the time you are at the hospital for your procedure. Both you and your visitor must wear a mask once you enter the hospital.   Patient reports does not currently have any symptoms concerning for COVID-19 and no household members with COVID-19 like illness.       Reviewed procedure/mask/visitor instructions with patient.     

## 2021-02-02 ENCOUNTER — Encounter (HOSPITAL_COMMUNITY)
Admission: RE | Disposition: A | Discharge status: Home / Self Care | Payer: Self-pay | Source: Home / Self Care | Attending: Cardiovascular Disease

## 2021-02-02 ENCOUNTER — Ambulatory Visit (HOSPITAL_COMMUNITY)
Admission: RE | Admit: 2021-02-02 | Discharge: 2021-02-02 | Disposition: A | Discharge status: Home / Self Care | Payer: Managed Care, Other (non HMO) | Attending: Cardiovascular Disease | Admitting: Cardiovascular Disease

## 2021-02-02 ENCOUNTER — Other Ambulatory Visit: Payer: Self-pay

## 2021-02-02 ENCOUNTER — Encounter (HOSPITAL_COMMUNITY): Payer: Self-pay | Admitting: Cardiovascular Disease

## 2021-02-02 DIAGNOSIS — I2510 Atherosclerotic heart disease of native coronary artery without angina pectoris: Secondary | ICD-10-CM

## 2021-02-02 DIAGNOSIS — Z888 Allergy status to other drugs, medicaments and biological substances status: Secondary | ICD-10-CM | POA: Diagnosis not present

## 2021-02-02 DIAGNOSIS — I952 Hypotension due to drugs: Secondary | ICD-10-CM

## 2021-02-02 DIAGNOSIS — Z79899 Other long term (current) drug therapy: Secondary | ICD-10-CM | POA: Insufficient documentation

## 2021-02-02 DIAGNOSIS — I712 Thoracic aortic aneurysm, without rupture: Secondary | ICD-10-CM | POA: Diagnosis not present

## 2021-02-02 DIAGNOSIS — Z7982 Long term (current) use of aspirin: Secondary | ICD-10-CM | POA: Diagnosis not present

## 2021-02-02 DIAGNOSIS — I25118 Atherosclerotic heart disease of native coronary artery with other forms of angina pectoris: Secondary | ICD-10-CM | POA: Diagnosis not present

## 2021-02-02 DIAGNOSIS — Z955 Presence of coronary angioplasty implant and graft: Secondary | ICD-10-CM | POA: Diagnosis not present

## 2021-02-02 DIAGNOSIS — I255 Ischemic cardiomyopathy: Secondary | ICD-10-CM | POA: Insufficient documentation

## 2021-02-02 DIAGNOSIS — Z87891 Personal history of nicotine dependence: Secondary | ICD-10-CM | POA: Diagnosis not present

## 2021-02-02 DIAGNOSIS — E782 Mixed hyperlipidemia: Secondary | ICD-10-CM

## 2021-02-02 HISTORY — PX: LEFT HEART CATH AND CORONARY ANGIOGRAPHY: CATH118249

## 2021-02-02 SURGERY — LEFT HEART CATH AND CORONARY ANGIOGRAPHY
Anesthesia: LOCAL

## 2021-02-02 MED ORDER — MIDAZOLAM HCL 2 MG/2ML IJ SOLN
INTRAMUSCULAR | Status: DC | PRN
  Administered 2021-02-02: 1 mg via INTRAVENOUS

## 2021-02-02 MED ORDER — HEPARIN (PORCINE) IN NACL 1000-0.9 UT/500ML-% IV SOLN
INTRAVENOUS | Status: DC | PRN
  Administered 2021-02-02 (×2): 500 mL

## 2021-02-02 MED ORDER — SODIUM CHLORIDE 0.9 % IV SOLN: 250.0000 mL | INTRAVENOUS | Status: DC | PRN

## 2021-02-02 MED ORDER — LIDOCAINE HCL (PF) 1 % IJ SOLN
INTRAMUSCULAR | Status: AC
  Filled 2021-02-02: qty 30

## 2021-02-02 MED ORDER — SODIUM CHLORIDE 0.9% FLUSH: 3.0000 mL | Freq: Two times a day (BID) | INTRAVENOUS | Status: DC

## 2021-02-02 MED ORDER — FENTANYL CITRATE (PF) 100 MCG/2ML IJ SOLN
INTRAMUSCULAR | Status: DC | PRN
  Administered 2021-02-02: 50 ug via INTRAVENOUS

## 2021-02-02 MED ORDER — SODIUM CHLORIDE 0.9% FLUSH: 3.0000 mL | INTRAVENOUS | Status: DC | PRN

## 2021-02-02 MED ORDER — HEPARIN (PORCINE) IN NACL 1000-0.9 UT/500ML-% IV SOLN
INTRAVENOUS | Status: AC
  Filled 2021-02-02: qty 1000

## 2021-02-02 MED ORDER — MIDAZOLAM HCL 2 MG/2ML IJ SOLN
INTRAMUSCULAR | Status: AC
  Filled 2021-02-02: qty 2

## 2021-02-02 MED ORDER — HEPARIN SODIUM (PORCINE) 1000 UNIT/ML IJ SOLN
INTRAMUSCULAR | Status: DC | PRN
  Administered 2021-02-02: 4000 [IU] via INTRAVENOUS

## 2021-02-02 MED ORDER — SODIUM CHLORIDE 0.9 % WEIGHT BASED INFUSION: 1.0000 mL/kg/h | INTRAVENOUS | Status: DC

## 2021-02-02 MED ORDER — ONDANSETRON HCL 4 MG/2ML IJ SOLN: 4.0000 mg | Freq: Four times a day (QID) | INTRAMUSCULAR | Status: DC | PRN

## 2021-02-02 MED ORDER — FENTANYL CITRATE (PF) 100 MCG/2ML IJ SOLN
INTRAMUSCULAR | Status: AC
  Filled 2021-02-02: qty 2

## 2021-02-02 MED ORDER — VERAPAMIL HCL 2.5 MG/ML IV SOLN
INTRAVENOUS | Status: DC | PRN
  Administered 2021-02-02: 10 mL via INTRA_ARTERIAL

## 2021-02-02 MED ORDER — SODIUM CHLORIDE 0.9 % WEIGHT BASED INFUSION
3.0000 mL/kg/h | INTRAVENOUS | Status: AC
  Administered 2021-02-02: 3 mL/kg/h via INTRAVENOUS

## 2021-02-02 MED ORDER — ASPIRIN 81 MG PO CHEW: 81.0000 mg | CHEWABLE_TABLET | ORAL | Status: DC

## 2021-02-02 MED ORDER — VERAPAMIL HCL 2.5 MG/ML IV SOLN
INTRAVENOUS | Status: AC
  Filled 2021-02-02: qty 2

## 2021-02-02 MED ORDER — HEPARIN SODIUM (PORCINE) 1000 UNIT/ML IJ SOLN
INTRAMUSCULAR | Status: AC
  Filled 2021-02-02: qty 1

## 2021-02-02 MED ORDER — LIDOCAINE HCL (PF) 1 % IJ SOLN
INTRAMUSCULAR | Status: DC | PRN
  Administered 2021-02-02: 2 mL

## 2021-02-02 MED ORDER — SODIUM CHLORIDE 0.9 % IV SOLN: INTRAVENOUS | Status: DC

## 2021-02-02 MED ORDER — ACETAMINOPHEN 325 MG PO TABS: 650.0000 mg | ORAL_TABLET | ORAL | Status: DC | PRN

## 2021-02-02 SURGICAL SUPPLY — 10 items
CATH INFINITI 5 FR JL3.5 (CATHETERS) ×1 IMPLANT
CATH INFINITI 5FR JK (CATHETERS) ×1 IMPLANT
CATH INFINITI JR4 5F (CATHETERS) ×1 IMPLANT
DEVICE RAD COMP TR BAND LRG (VASCULAR PRODUCTS) ×1 IMPLANT
GLIDESHEATH SLEND SS 6F .021 (SHEATH) ×1 IMPLANT
KIT HEART LEFT (KITS) ×2 IMPLANT
PACK CARDIAC CATHETERIZATION (CUSTOM PROCEDURE TRAY) ×2 IMPLANT
TRANSDUCER W/STOPCOCK (MISCELLANEOUS) ×2 IMPLANT
TUBING CIL FLEX 10 FLL-RA (TUBING) ×2 IMPLANT
WIRE HI TORQ VERSACORE J 260CM (WIRE) ×1 IMPLANT

## 2021-02-02 NOTE — Interval H&P Note (Signed)
History and Physical Interval Note:  02/02/2021 7:35 AM  Matthew Patel  has presented today for surgery, with the diagnosis of chest pain.  The various methods of treatment have been discussed with the patient and family. After consideration of risks, benefits and other options for treatment, the patient has consented to  Procedure(s): LEFT HEART CATH AND CORONARY ANGIOGRAPHY (N/A) as a surgical intervention.  The patient's history has been reviewed, patient examined, no change in status, stable for surgery.  I have reviewed the patient's chart and labs.  Questions were answered to the patient's satisfaction.     Lorine Bears

## 2021-02-03 ENCOUNTER — Other Ambulatory Visit: Payer: Self-pay | Admitting: Legal Medicine

## 2021-03-19 NOTE — Progress Notes (Signed)
Cardiology Office Note:    Date:  03/20/2021   ID:  BRAUN ROCCA, DOB 1966/04/21, MRN [id number]  PCP:  Abigail Miyamoto, MD  Cardiologist:  Norman Herrlich, MD    Referring MD: Abigail Miyamoto,*    ASSESSMENT:    1. CAD in native artery   2. Cardiomyopathy, ischemic   3. Mixed hyperlipidemia    PLAN:    In order of problems listed above:  Stable will discontinue Brilinta 1 year after PCI and stent 07/05/2019 to remain on aspirin.  Recent coronary angiography shows no evidence of significant CAD or restenosis LV function is normalized and after 6 months of continuous dual antiplatelet therapy can withdraw Brilinta for a week prior to elective podiatry surgery and resume 24 to 48 hours afterwards and remain on aspirin. Stable EF is normalized continue his beta-blocker Stable continue with statin recheck CMP and lipid profile today   Next appointment: I will see back in the office in 1 year   Medication Adjustments/Labs and Tests Ordered: Current medicines are reviewed at length with the patient today.  Concerns regarding medicines are outlined above.  No orders of the defined types were placed in this encounter.  No orders of the defined types were placed in this encounter.   Chief Complaint  Patient presents with   Follow-up   Coronary Artery Disease     History of Present Illness:    Matthew Patel is a 55 y.o. male with a hx of CAD with ST elevation MI and PCI and stent drug-eluting right coronary artery February 2021.  His course was complicated by reperfusion mediated hypotension idioventricular rhythm requiring fluid resuscitation IV pressors Levophed and IV amiodarone for heart rate control.  His initial echocardiogram showed an EF of 40 to 45% and developed symptomatic hypotension with carvedilol and ARB.  He was last seen 01/31/2021.  Compliance with diet, lifestyle and medications: Yes  Is reassuring findings coronary angiography He continues to  have heartburn after his meal and I asked him to start to take an aspirin.  We will continue his PPI. He has heel pain and scheduled for podiatry surgery and I told him that at this time I think is reasonable to withdraw his Brilinta for 1 week before surgery continue aspirin resume afterwards until lumbar surgical procedure especially in view of the findings of coronary angiography. He tolerates his statin without muscle pain or weakness.  No angina shortness of breath palpitation or syncope  Recent left heart catheterization shows widely patent stent right coronary artery without restenosis and mild stenosis of the proximal right coronary artery thought to be due to catheter induced spasm he had nonobstructive disease affecting the left coronaries and normal left ventricular ejection fraction and end-diastolic pressure. Past Medical History:  Diagnosis Date   CAD in native artery 07/05/2020   Cardiomyopathy, ischemic 07/05/2020   GERD (gastroesophageal reflux disease)    Migraines    Mixed hyperlipidemia    S/P angioplasty with stent RCA DES 07/04/20 07/05/2020   Tobacco abuse 07/05/2020    Past Surgical History:  Procedure Laterality Date   CORONARY/GRAFT ACUTE MI REVASCULARIZATION N/A 07/04/2020   Procedure: Coronary/Graft Acute MI Revascularization;  Surgeon: Lennette Bihari, MD;  Location: [facility] INVASIVE CV LAB;  Service: Cardiovascular;  Laterality: N/A;   LEFT HEART CATH AND CORONARY ANGIOGRAPHY N/A 07/04/2020   Procedure: LEFT HEART CATH AND CORONARY ANGIOGRAPHY;  Surgeon: Lennette Bihari, MD;  Location: [facility] INVASIVE CV LAB;  Service: Cardiovascular;  Laterality: N/A;   LEFT HEART CATH AND CORONARY ANGIOGRAPHY N/A 02/02/2021   Procedure: LEFT HEART CATH AND CORONARY ANGIOGRAPHY;  Surgeon: Iran Ouch, MD;  Location: [facility] INVASIVE CV LAB;  Service: Cardiovascular;  Laterality: N/A;    Current Medications: Current Meds  Medication Sig   acetaminophen (TYLENOL) 325 MG tablet Take 2 tablets  (650 mg total) by mouth every 4 (four) hours as needed for headache or mild pain.   aspirin 81 MG chewable tablet Chew 1 tablet (81 mg total) by mouth daily.   COSENTYX SENSOREADY, 300 MG, 150 MG/ML SOAJ Inject 2 Syringes into the skin every 28 (twenty-eight) days.   fluticasone (FLONASE) 50 MCG/ACT nasal spray Place 1-2 sprays into both nostrils daily as needed for allergies or rhinitis.   Menthol, Topical Analgesic, (MINERAL ICE EX) Apply 1 application topically daily as needed (foot pain).   nitroGLYCERIN (NITROSTAT) 0.4 MG SL tablet Place 1 tablet (0.4 mg total) under the tongue every 5 (five) minutes as needed for chest pain.   omeprazole (PRILOSEC) 40 MG capsule TAKE 1 CAPSULE BY MOUTH TWICE DAILY BEFORE A MEAL   [DISCONTINUED] atorvastatin (LIPITOR) 40 MG tablet Take 1 tablet (40 mg total) by mouth daily.   [DISCONTINUED] metoprolol succinate (TOPROL XL) 25 MG 24 hr tablet Take 0.5 tablets (12.5 mg total) by mouth daily.   [DISCONTINUED] ticagrelor (BRILINTA) 90 MG TABS tablet Take 1 tablet (90 mg total) by mouth 2 (two) times daily.   Current Facility-Administered Medications for the 03/20/21 encounter (Office Visit) with Baldo Daub, MD  Medication   sodium chloride flush (NS) 0.9 % injection 3 mL     Allergies:   Ranitidine   Social History   Socioeconomic History   Marital status: Single    Spouse name: Not on file   Number of children: Not on file   Years of education: Not on file   Highest education level: Not on file  Occupational History   Not on file  Tobacco Use   Smoking status: Former    Packs/day: 1.50    Types: Cigarettes    Quit date: 07/04/2020    Years since quitting: 0.7   Smokeless tobacco: Never  Vaping Use   Vaping Use: Never used  Substance and Sexual Activity   Alcohol use: Never   Drug use: Never   Sexual activity: Not on file  Other Topics Concern   Not on file  Social History Narrative   Not on file   Social Determinants of Health    Financial Resource Strain: Not on file  Food Insecurity: Not on file  Transportation Needs: Not on file  Physical Activity: Not on file  Stress: Not on file  Social Connections: Not on file     Family History: The patient's family history includes Diabetes in an other family member; Heart disease in an other family member; Hypertension in an other family member. ROS:   Please see the history of present illness.    All other systems reviewed and are negative.  EKGs/Labs/Other Studies Reviewed:    The following studies were reviewed today:    Recent Labs: 08/30/2020: ALT 29 01/31/2021: BUN 11; Creatinine, Ser 0.84; Hemoglobin 14.2; Platelets 250; Potassium 4.3; Sodium 143  Recent Lipid Panel    Component Value Date/Time   CHOL 90 (L) 08/30/2020 1046   TRIG 74 08/30/2020 1046   HDL 36 (L) 08/30/2020 1046   CHOLHDL 2.5 08/30/2020 1046   CHOLHDL 5.7 07/04/2020 0200   VLDL  35 07/04/2020 0200   LDLCALC 38 08/30/2020 1046    Physical Exam:    VS:  BP 120/78 (BP Location: Right Arm, Patient Position: Sitting, Cuff Size: Normal)   Pulse 64   Ht 5\' 7"  (1.702 m)   Wt 154 lb 3.2 oz (69.9 kg)   SpO2 99%   BMI 24.15 kg/m     Wt Readings from Last 3 Encounters:  03/20/21 154 lb 3.2 oz (69.9 kg)  02/02/21 150 lb (68 kg)  01/31/21 151 lb (68.5 kg)     GEN:  Well nourished, well developed in no acute distress HEENT: Normal NECK: No JVD; No carotid bruits LYMPHATICS: No lymphadenopathy CARDIAC: RRR, no murmurs, rubs, gallops RESPIRATORY:  Clear to auscultation without rales, wheezing or rhonchi  ABDOMEN: Soft, non-tender, non-distended MUSCULOSKELETAL:  No edema; No deformity  SKIN: Warm and dry NEUROLOGIC:  Alert and oriented x 3 PSYCHIATRIC:  Normal affect    Signed, 04/03/21, MD  03/20/2021 8:57 AM    [facility]

## 2021-03-20 ENCOUNTER — Ambulatory Visit: Payer: Managed Care, Other (non HMO) | Admitting: Cardiology

## 2021-03-20 ENCOUNTER — Encounter: Payer: Self-pay | Admitting: Cardiology

## 2021-03-20 ENCOUNTER — Other Ambulatory Visit: Payer: Self-pay

## 2021-03-20 VITALS — BP 120/78 | HR 64 | Ht 67.0 in | Wt 154.2 lb

## 2021-03-20 DIAGNOSIS — E782 Mixed hyperlipidemia: Secondary | ICD-10-CM

## 2021-03-20 DIAGNOSIS — I2510 Atherosclerotic heart disease of native coronary artery without angina pectoris: Secondary | ICD-10-CM | POA: Diagnosis not present

## 2021-03-20 DIAGNOSIS — I255 Ischemic cardiomyopathy: Secondary | ICD-10-CM | POA: Diagnosis not present

## 2021-03-20 LAB — COMPREHENSIVE METABOLIC PANEL
ALT: 21 IU/L (ref 0–44)
AST: 13 IU/L (ref 0–40)
Albumin/Globulin Ratio: 2.1 (ref 1.2–2.2)
Albumin: 4.6 g/dL (ref 3.8–4.9)
Alkaline Phosphatase: 48 IU/L (ref 44–121)
BUN/Creatinine Ratio: 14 (ref 9–20)
BUN: 13 mg/dL (ref 6–24)
Bilirubin Total: 0.6 mg/dL (ref 0.0–1.2)
CO2: 25 mmol/L (ref 20–29)
Calcium: 9.6 mg/dL (ref 8.7–10.2)
Chloride: 104 mmol/L (ref 96–106)
Creatinine, Ser: 0.9 mg/dL (ref 0.76–1.27)
Globulin, Total: 2.2 g/dL (ref 1.5–4.5)
Glucose: 93 mg/dL (ref 65–99)
Potassium: 4.2 mmol/L (ref 3.5–5.2)
Sodium: 140 mmol/L (ref 134–144)
Total Protein: 6.8 g/dL (ref 6.0–8.5)
eGFR: 103 mL/min/{1.73_m2} (ref >59)

## 2021-03-20 LAB — LIPID PANEL
Chol/HDL Ratio: 2.1 ratio (ref 0.0–5.0)
Cholesterol, Total: 87 mg/dL — ABNORMAL LOW (ref 100–199)
HDL: 41 mg/dL (ref >39)
LDL Chol Calc (NIH): 30 mg/dL (ref 0–99)
Triglycerides: 74 mg/dL (ref 0–149)
VLDL Cholesterol Cal: 16 mg/dL (ref 5–40)

## 2021-03-20 MED ORDER — TICAGRELOR 90 MG PO TABS: 90.0000 mg | ORAL_TABLET | Freq: Two times a day (BID) | ORAL | 3 refills | Status: DC

## 2021-03-20 MED ORDER — ATORVASTATIN CALCIUM 40 MG PO TABS
40.0000 mg | ORAL_TABLET | Freq: Every day | ORAL | 3 refills | Status: DC
Start: 2021-03-20 — End: 2022-05-17

## 2021-03-20 MED ORDER — METOPROLOL SUCCINATE ER 25 MG PO TB24: 12.5000 mg | ORAL_TABLET | Freq: Every day | ORAL | 3 refills | Status: DC

## 2021-03-20 NOTE — Patient Instructions (Signed)
Medication Instructions:  Your physician has recommended you make the following change in your medication:  STOP on 07/04/2021: Brilinta  *If you need a refill on your cardiac medications before your next appointment, please call your pharmacy*   Lab Work: Your physician recommends that you return for lab work in:  TODAY: CMP, Lipids If you have labs (blood work) drawn today and your tests are completely normal, you will receive your results only by: MyChart Message (if you have MyChart) OR A paper copy in the mail If you have any lab test that is abnormal or we need to change your treatment, we will call you to review the results.   Testing/Procedures: None   Follow-Up: At [facility], you and your health needs are our priority.  As part of our continuing mission to provide you with exceptional heart care, we have created designated Provider Care Teams.  These Care Teams include your primary Cardiologist (physician) and Advanced Practice Providers (APPs -  Physician Assistants and Nurse Practitioners) who all work together to provide you with the care you need, when you need it.  We recommend signing up for the patient portal called "MyChart".  Sign up information is provided on this After Visit Summary.  MyChart is used to connect with patients for Virtual Visits (Telemedicine).  Patients are able to view lab/test results, encounter notes, upcoming appointments, etc.  Non-urgent messages can be sent to your provider as well.   To learn more about what you can do with MyChart, go to [email or web address].    Your next appointment:   1 year(s)  The format for your next appointment:   In Person  Provider:   Norman Herrlich, MD   Other Instructions

## 2021-03-30 IMAGING — DX DG CHEST 1V
1 series · 1 of 1 positions shown · non-contrast
Comparison: October 08, 2019

CLINICAL DATA: Code STEMI

EXAM:
CHEST  1 VIEW

[chest ap]
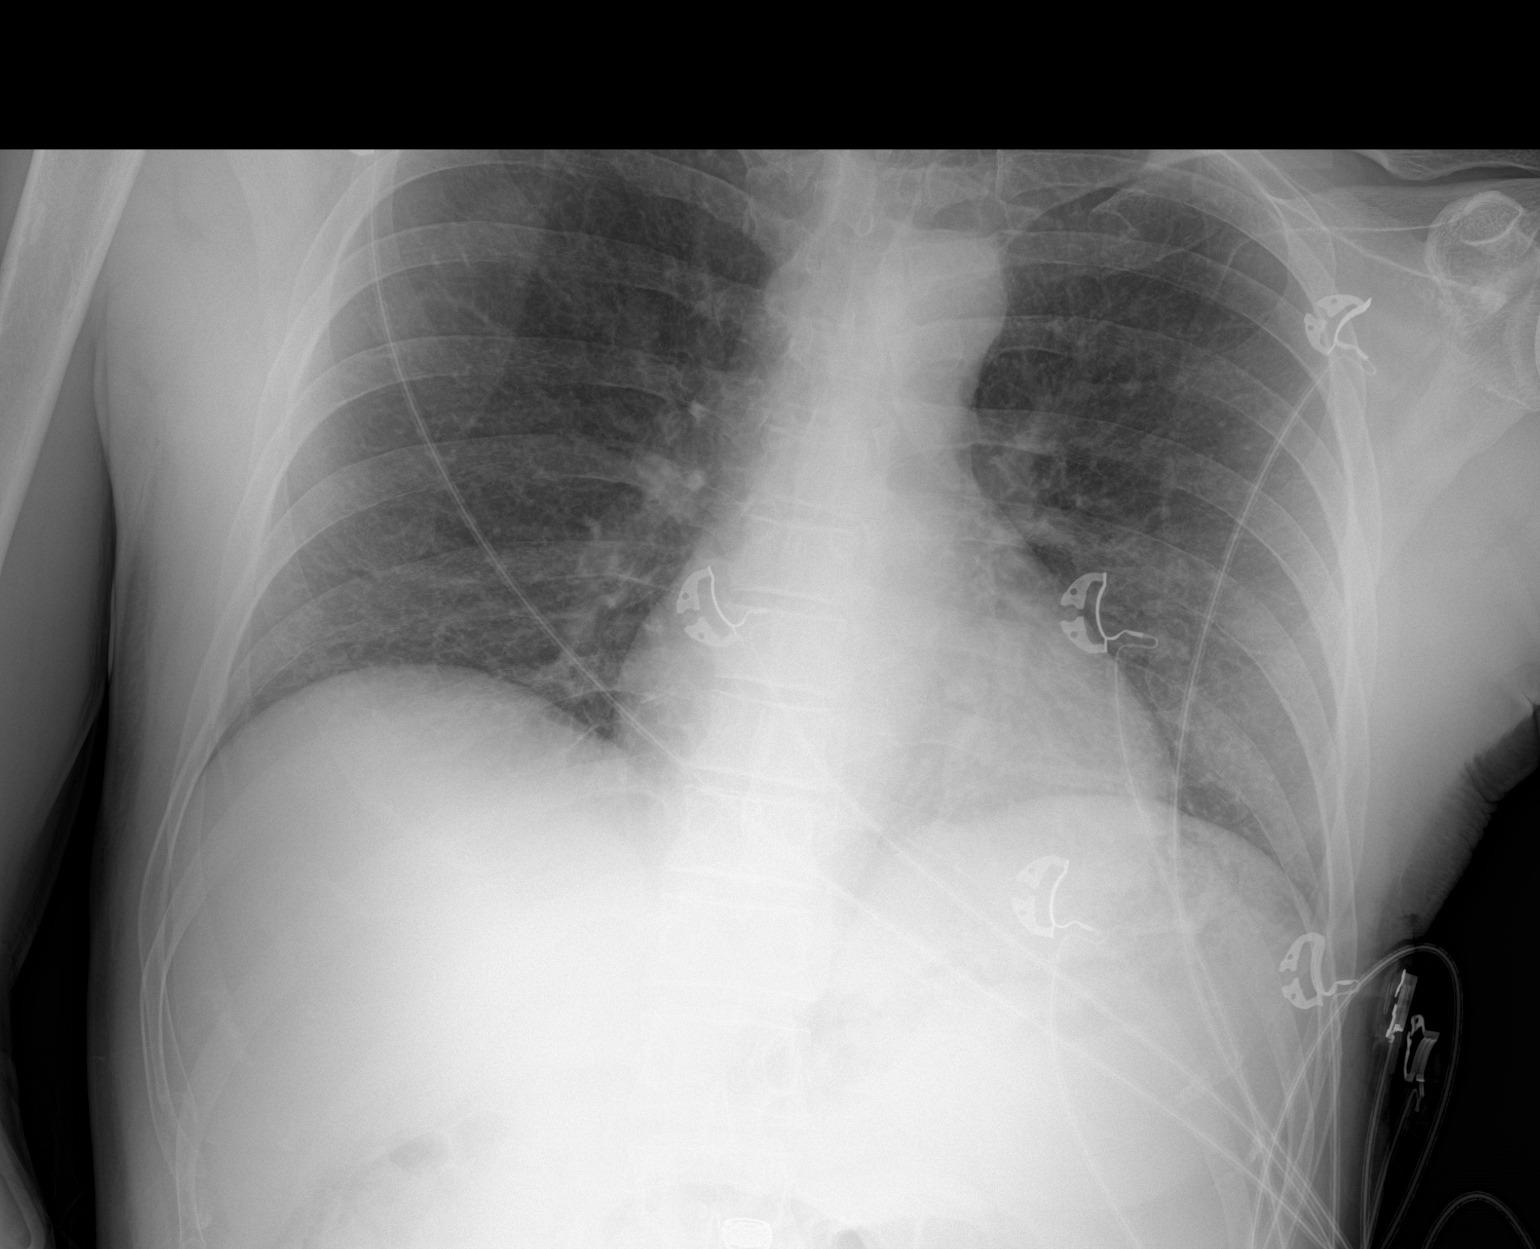

[1 of 1 positions shown; findings below may reference images not displayed]

FINDINGS: The heart size and mediastinal contours are within normal limits.
Both lungs are clear. The visualized skeletal structures are
unremarkable.
IMPRESSION: No active disease.

## 2021-04-16 ENCOUNTER — Other Ambulatory Visit: Payer: Self-pay

## 2021-04-16 ENCOUNTER — Ambulatory Visit: Payer: Managed Care, Other (non HMO) | Admitting: Podiatry

## 2021-04-16 DIAGNOSIS — M722 Plantar fascial fibromatosis: Secondary | ICD-10-CM

## 2021-04-16 DIAGNOSIS — L989 Disorder of the skin and subcutaneous tissue, unspecified: Secondary | ICD-10-CM | POA: Diagnosis not present

## 2021-04-16 DIAGNOSIS — M216X9 Other acquired deformities of unspecified foot: Secondary | ICD-10-CM

## 2021-04-16 NOTE — Progress Notes (Signed)
  Subjective:  Patient ID: Matthew Patel, male    DOB: 1966-04-13,  MRN: [id number]  Chief Complaint  Patient presents with   Plantar Fasciitis    Lt PF -pt states his Lt PF has remained the same and wants to reschedule sx that he had to cx in Dec due to a heart attack- pt stats pain is 5/10 sharp constant pain but it can get worse - pain remains the same as before   55 y.o. male presents with the above complaint.  History above confirmed with patient  Objective:  Physical Exam: warm, good capillary refill, no trophic changes or ulcerative lesions, normal DP and PT pulses, and normal sensory exam. Left Foot: tenderness to palpation medial calcaneal tuber, tenderness to palpation posterior calcaneus, no pain with calcaneal squeeze, decreased ankle joint ROM, and +Silverskiold test. POP medial border left hallux.  Small punctate lesion distal to the plantar heel with pain to palpation   Assessment:   1. Plantar fasciitis   2. Equinus deformity of foot   3. Benign skin lesion     Plan:  Patient was evaluated and treated and all questions answered.  Achilles Tendonitis and Plantar Fasciitis  -Patient would like to again proceed with intervention.  He has been cleared by cardiology for his procedure.  He will need to stop his Brilinta prior to surgery. We drafted and signed new consent forms for surgery today. -Planned procedures: left foot plantar fasciotomy, Gastrocnemius recession - open vs endoscopic.  Excision skin lesion plantar heel -Risk factors: tobacco use. -Boot dispensed for post-op use.  No follow-ups on file.

## 2021-04-18 ENCOUNTER — Telehealth: Payer: Self-pay | Admitting: Urology

## 2021-04-18 NOTE — Telephone Encounter (Signed)
DOS - 05/02/21  EPF LEFT --- 19379 EXC BENIGN LESION LEFT --- 02409 GASTROCNEMIUS RECESS LEFT --- 73532   [employer] EFFECTIVE DATE - 07/29/2018   PER [employer]'S AUTOMATIVE SYSTEM FOR CPT CODES 99242, 11421 AND 68341 NO PRIOR AUTH IS REQUIRED.  REF # [id number] REF # [id number] REF # [id number]

## 2021-05-02 ENCOUNTER — Other Ambulatory Visit: Payer: Self-pay | Admitting: Podiatry

## 2021-05-02 ENCOUNTER — Encounter: Payer: Self-pay | Admitting: Podiatry

## 2021-05-02 DIAGNOSIS — M216X2 Other acquired deformities of left foot: Secondary | ICD-10-CM | POA: Diagnosis not present

## 2021-05-02 DIAGNOSIS — M722 Plantar fascial fibromatosis: Secondary | ICD-10-CM | POA: Diagnosis not present

## 2021-05-02 DIAGNOSIS — L989 Disorder of the skin and subcutaneous tissue, unspecified: Secondary | ICD-10-CM | POA: Diagnosis not present

## 2021-05-02 MED ORDER — OXYCODONE-ACETAMINOPHEN 5-325 MG PO TABS: 1.0000 | ORAL_TABLET | ORAL | 0 refills | Status: DC | PRN

## 2021-05-02 MED ORDER — CEPHALEXIN 500 MG PO CAPS: ORAL_CAPSULE | ORAL | 0 refills | Status: DC

## 2021-05-02 NOTE — Progress Notes (Signed)
Rx sent to pharmacy for outpatient surgery. °

## 2021-05-07 ENCOUNTER — Telehealth: Payer: Self-pay | Admitting: *Deleted

## 2021-05-07 NOTE — Telephone Encounter (Signed)
Patient's recently had surgery last week and the bottom of foot (incision area) is leaking. Should they be concerned? Please advise.

## 2021-05-08 NOTE — Telephone Encounter (Signed)
Spoke with patient  to give information per Dr Allena Katz, said that it's bleeding not clear or yellow but has an upcoming appointment on 05/10/21.

## 2021-05-09 ENCOUNTER — Other Ambulatory Visit: Payer: Self-pay | Admitting: Sports Medicine

## 2021-05-09 ENCOUNTER — Telehealth: Payer: Self-pay | Admitting: *Deleted

## 2021-05-09 MED ORDER — HYDROCODONE-ACETAMINOPHEN 5-325 MG PO TABS: 1.0000 | ORAL_TABLET | Freq: Four times a day (QID) | ORAL | 0 refills | Status: AC | PRN

## 2021-05-09 NOTE — Progress Notes (Signed)
Changed percocet to norco

## 2021-05-09 NOTE — Telephone Encounter (Signed)
-----   Message from Asencion Islam, [place] sent at 05/09/2021  2:10 PM EDT ----- Changed percocet to norco to see if he can tolerate this pain medication better until he can see Dr. Samuella Cota ----- Message ----- From: Lanney Gins, [facility] Sent: 05/09/2021   1:50 PM EDT To: Asencion Islam, DPM  Patient called and stated that the pain medicine that was given the day of surgery is making the patient sick and is in a lot of pain. Misty Stanley

## 2021-05-09 NOTE — Telephone Encounter (Signed)
Tried to call the patient back today to let patient know that Dr Marylene Land changed the pain medicine due to patient was getting sick and the voice mail is full and can not take messages. Misty Stanley

## 2021-05-10 ENCOUNTER — Ambulatory Visit (INDEPENDENT_AMBULATORY_CARE_PROVIDER_SITE_OTHER): Payer: Managed Care, Other (non HMO) | Admitting: Podiatry

## 2021-05-10 ENCOUNTER — Other Ambulatory Visit: Payer: Self-pay

## 2021-05-10 ENCOUNTER — Encounter: Payer: Self-pay | Admitting: Podiatry

## 2021-05-10 DIAGNOSIS — Z9889 Other specified postprocedural states: Secondary | ICD-10-CM

## 2021-05-10 NOTE — Progress Notes (Signed)
  Subjective:  Patient ID: Matthew Patel, male    DOB: [birth year],  MRN: [id number]  Chief Complaint  Patient presents with   Routine Post Op    I am doing better and there was some blood on the incision area on the left foot     DOS: 05/02/21 Procedure: EPF, EGR, excision of skin lesion left foot  55 y.o. male presents with the above complaint. History confirmed with patient.  States some mild pain and irritation from the sutures to the otherwise denies new issues  Objective:  Physical Exam: tenderness at the surgical site, local edema noted, and calf supple, nontender. Nerve exam intact. Incision: healing well, no significant drainage, no dehiscence, no significant erythema  Assessment:   1. Post-operative state     Plan:  Patient was evaluated and treated and all questions answered.  Post-operative State -Ok to start showering at this time. Advised they cannot soak. -Dressing applied consisting of sterile gauze and kerlix -WBAT in CAM boot -Plan for suture removal next week -XRs needed at follow-up: none  No follow-ups on file.

## 2021-05-11 ENCOUNTER — Encounter: Payer: Self-pay | Admitting: Podiatry

## 2021-05-17 ENCOUNTER — Ambulatory Visit (INDEPENDENT_AMBULATORY_CARE_PROVIDER_SITE_OTHER): Payer: Managed Care, Other (non HMO) | Admitting: Podiatry

## 2021-05-17 ENCOUNTER — Encounter: Payer: Self-pay | Admitting: Podiatry

## 2021-05-17 DIAGNOSIS — Z9889 Other specified postprocedural states: Secondary | ICD-10-CM

## 2021-05-17 DIAGNOSIS — M722 Plantar fascial fibromatosis: Secondary | ICD-10-CM

## 2021-05-17 NOTE — Progress Notes (Signed)
  Subjective:  Patient ID: Matthew Patel, male    DOB: 1966-02-03,  MRN: [id number]  No chief complaint on file.   DOS: 05/02/21 Procedure: EPF, EGR, excision of skin lesion left foot  55 y.o. male presents with the above complaint. History confirmed with patient.  States the bottom of the heel is the spot that hurts the most, otherwise he has some rubbing from the boot; states the back of the ankle / Achilles tendon area is already feeling more loose and better than it did prior to surgery. Objective:  Physical Exam: tenderness at the surgical site, local edema noted, and calf supple, nontender. Nerve exam intact. Incision: well healed.  Assessment:   1. Plantar fasciitis   2. Post-operative state    Plan:  Patient was evaluated and treated and all questions answered.  Post-operative State -Sutures removed -Ok to start showering at this time. Advised they cannot soak. -WBAT in Surgical shoe -Can continue to transition to normal activity as tolerated. -XRs needed at follow-up: none  No follow-ups on file.

## 2021-05-24 ENCOUNTER — Encounter: Payer: Managed Care, Other (non HMO) | Admitting: Podiatry

## 2021-06-07 ENCOUNTER — Ambulatory Visit (INDEPENDENT_AMBULATORY_CARE_PROVIDER_SITE_OTHER): Payer: Managed Care, Other (non HMO) | Admitting: Podiatry

## 2021-06-07 ENCOUNTER — Other Ambulatory Visit: Payer: Self-pay

## 2021-06-07 ENCOUNTER — Encounter: Payer: Self-pay | Admitting: Podiatry

## 2021-06-07 DIAGNOSIS — Z9889 Other specified postprocedural states: Secondary | ICD-10-CM

## 2021-06-07 DIAGNOSIS — M722 Plantar fascial fibromatosis: Secondary | ICD-10-CM

## 2021-06-07 NOTE — Progress Notes (Signed)
  Subjective:  Patient ID: VIET KEMMERER, male    DOB: 06/04/1966,  MRN: [id number]  Chief Complaint  Patient presents with   Routine Post Op    There is a knot on the left calf area where the incision is and it does not hurt and I noticed it last night    DOS: 05/02/21 Procedure: EPF, EGR, excision of skin lesion left foot  55 y.o. male presents with the above complaint. History confirmed with patient. Bottom of the heel is improving but still feels like his ankle is tight. Objective:  Physical Exam: no tenderness at the surgical site, no edema noted, and calf supple, nontender. Nerve exam intact. Incision: well healed.  Assessment:   1. Plantar fasciitis   2. Post-operative state    Plan:  Patient was evaluated and treated and all questions answered.  Post-operative State -Doing overall well, not having pain in the heel as previous, still feels like his ankle is tight but his ROM is full. I think this will get better as he gets used to normal WB. Will transition into normal shoegear. -Can continue to transition to normal activity as tolerated. -XRs needed at follow-up: none  No follow-ups on file.

## 2021-06-14 ENCOUNTER — Encounter: Payer: Self-pay | Admitting: Cardiology

## 2021-06-20 ENCOUNTER — Ambulatory Visit (INDEPENDENT_AMBULATORY_CARE_PROVIDER_SITE_OTHER): Payer: Managed Care, Other (non HMO) | Admitting: Legal Medicine

## 2021-06-20 ENCOUNTER — Encounter: Payer: Self-pay | Admitting: Legal Medicine

## 2021-06-20 VITALS — BP 110/70 | HR 96 | Temp 98.7°F | Resp 16 | Ht 67.0 in | Wt 147.0 lb

## 2021-06-20 DIAGNOSIS — J0100 Acute maxillary sinusitis, unspecified: Secondary | ICD-10-CM | POA: Diagnosis not present

## 2021-06-20 DIAGNOSIS — R509 Fever, unspecified: Secondary | ICD-10-CM

## 2021-06-20 LAB — POCT INFLUENZA A/B
Influenza A, POC: NEGATIVE
Influenza B, POC: NEGATIVE

## 2021-06-20 LAB — POC COVID19 BINAXNOW: SARS Coronavirus 2 Ag: NEGATIVE

## 2021-06-20 MED ORDER — PREDNISONE 10 MG (21) PO TBPK: ORAL_TABLET | ORAL | 0 refills | Status: DC

## 2021-06-20 MED ORDER — AZITHROMYCIN 250 MG PO TABS: ORAL_TABLET | ORAL | 0 refills | Status: AC

## 2021-06-20 NOTE — Progress Notes (Signed)
Acute Office Visit  Subjective:    Patient ID: Matthew Patel, male    DOB: 07-31-1965, 55 y.o.   MRN: [id number]  Chief Complaint  Patient presents with   Fever   Sinusitis    HPI: Patient is in today for sick for 2 days with sinus pain and congestion. Cough productive of yellow sputum. Negative Covid, negative Influenza  Past Medical History:  Diagnosis Date   CAD in native artery 07/05/2020   Cardiomyopathy, ischemic 07/05/2020   GERD (gastroesophageal reflux disease)    Migraines    Mixed hyperlipidemia    S/P angioplasty with stent RCA DES 07/04/20 07/05/2020   Tobacco abuse 07/05/2020    Past Surgical History:  Procedure Laterality Date   CORONARY/GRAFT ACUTE MI REVASCULARIZATION N/A 07/04/2020   Procedure: Coronary/Graft Acute MI Revascularization;  Surgeon: Lennette Bihari, MD;  Location: [facility] INVASIVE CV LAB;  Service: Cardiovascular;  Laterality: N/A;   LEFT HEART CATH AND CORONARY ANGIOGRAPHY N/A 07/04/2020   Procedure: LEFT HEART CATH AND CORONARY ANGIOGRAPHY;  Surgeon: Lennette Bihari, MD;  Location: [facility] INVASIVE CV LAB;  Service: Cardiovascular;  Laterality: N/A;   LEFT HEART CATH AND CORONARY ANGIOGRAPHY N/A 02/02/2021   Procedure: LEFT HEART CATH AND CORONARY ANGIOGRAPHY;  Surgeon: Iran Ouch, MD;  Location: [facility] INVASIVE CV LAB;  Service: Cardiovascular;  Laterality: N/A;    Family History  Problem Relation Age of Onset   Diabetes Other    Hypertension Other    Heart disease Other     Social History   Socioeconomic History   Marital status: Single    Spouse name: Not on file   Number of children: Not on file   Years of education: Not on file   Highest education level: Not on file  Occupational History   Not on file  Tobacco Use   Smoking status: Former    Packs/day: 1.50    Types: Cigarettes    Quit date: 07/04/2020    Years since quitting: 0.9   Smokeless tobacco: Never  Vaping Use   Vaping Use: Never used  Substance and Sexual Activity    Alcohol use: Never   Drug use: Never   Sexual activity: Not on file  Other Topics Concern   Not on file  Social History Narrative   Not on file   Social Determinants of Health   Financial Resource Strain: Not on file  Food Insecurity: Not on file  Transportation Needs: Not on file  Physical Activity: Not on file  Stress: Not on file  Social Connections: Not on file  Intimate Partner Violence: Not on file    Outpatient Medications Prior to Visit  Medication Sig Dispense Refill   acetaminophen (TYLENOL) 325 MG tablet Take 2 tablets (650 mg total) by mouth every 4 (four) hours as needed for headache or mild pain.     aspirin 81 MG chewable tablet Chew 1 tablet (81 mg total) by mouth daily.     COSENTYX SENSOREADY, 300 MG, 150 MG/ML SOAJ Inject 2 Syringes into the skin every 28 (twenty-eight) days.     fluticasone (FLONASE) 50 MCG/ACT nasal spray Place 1-2 sprays into both nostrils daily as needed for allergies or rhinitis.     Menthol, Topical Analgesic, (MINERAL ICE EX) Apply 1 application topically daily as needed (foot pain).     metoprolol succinate (TOPROL XL) 25 MG 24 hr tablet Take 0.5 tablets (12.5 mg total) by mouth daily. 45 tablet 3   nitroGLYCERIN (NITROSTAT) 0.4  MG SL tablet Place 1 tablet (0.4 mg total) under the tongue every 5 (five) minutes as needed for chest pain. 25 tablet 4   omeprazole (PRILOSEC) 40 MG capsule TAKE 1 CAPSULE BY MOUTH TWICE DAILY BEFORE A MEAL 180 capsule 2   ticagrelor (BRILINTA) 90 MG TABS tablet Take 1 tablet (90 mg total) by mouth 2 (two) times daily. 180 tablet 3   atorvastatin (LIPITOR) 40 MG tablet Take 1 tablet (40 mg total) by mouth daily. 90 tablet 3   Facility-Administered Medications Prior to Visit  Medication Dose Route Frequency Provider Last Rate Last Admin   sodium chloride flush (NS) 0.9 % injection 3 mL  3 mL Intravenous Q12H Richardo Priest, MD        Allergies  Allergen Reactions   Ranitidine Swelling    Review of Systems   Constitutional:  Positive for chills, fatigue and fever.  HENT:  Positive for congestion and sinus pain. Negative for ear pain and sore throat.   Respiratory:  Positive for cough. Negative for shortness of breath.   Cardiovascular:  Negative for chest pain.  Musculoskeletal:  Negative for myalgias.  Neurological:  Positive for headaches.      Objective:    Physical Exam Vitals reviewed.  Constitutional:      General: He is in acute distress.     Appearance: Normal appearance.  HENT:     Right Ear: Tympanic membrane normal.     Left Ear: Tympanic membrane normal.     Mouth/Throat:     Mouth: Mucous membranes are moist.     Pharynx: Oropharynx is clear.  Eyes:     Extraocular Movements: Extraocular movements intact.     Conjunctiva/sclera: Conjunctivae normal.     Pupils: Pupils are equal, round, and reactive to light.  Cardiovascular:     Rate and Rhythm: Normal rate and regular rhythm.     Pulses: Normal pulses.     Heart sounds: Normal heart sounds. No murmur heard.   No gallop.  Abdominal:     General: Abdomen is flat. Bowel sounds are normal. There is no distension.     Palpations: Abdomen is soft.     Tenderness: There is no abdominal tenderness.  Musculoskeletal:        General: Normal range of motion.     Cervical back: Normal range of motion and neck supple.     Right lower leg: No edema.     Left lower leg: No edema.  Skin:    General: Skin is warm.     Capillary Refill: Capillary refill takes less than 2 seconds.  Neurological:     General: No focal deficit present.     Mental Status: He is oriented to person, place, and time. Mental status is at baseline.  Psychiatric:        Mood and Affect: Mood normal.        Thought Content: Thought content normal.    BP 110/70   Pulse 96   Temp 98.7 F (37.1 C)   Resp 16   Ht $R'5\' 7"'zi$  (1.702 m)   Wt 147 lb (66.7 kg)   SpO2 97%   BMI 23.02 kg/m  Wt Readings from Last 3 Encounters:  06/20/21 147 lb (66.7 kg)   03/20/21 154 lb 3.2 oz (69.9 kg)  02/02/21 150 lb (68 kg)    Health Maintenance Due  Topic Date Due   HIV Screening  Never done   Hepatitis C Screening  Never done  TETANUS/TDAP  Never done   COLONOSCOPY (Pts 45-45yrs Insurance coverage will need to be confirmed)  Never done    There are no preventive care reminders to display for this patient.   No results found for: TSH Lab Results  Component Value Date   WBC 7.6 01/31/2021   HGB 14.2 01/31/2021   HCT 42.5 01/31/2021   MCV 92 01/31/2021   PLT 250 01/31/2021   Lab Results  Component Value Date   NA 140 03/20/2021   K 4.2 03/20/2021   CO2 25 03/20/2021   GLUCOSE 93 03/20/2021   BUN 13 03/20/2021   CREATININE 0.90 03/20/2021   BILITOT 0.6 03/20/2021   ALKPHOS 48 03/20/2021   AST 13 03/20/2021   ALT 21 03/20/2021   PROT 6.8 03/20/2021   ALBUMIN 4.6 03/20/2021   CALCIUM 9.6 03/20/2021   ANIONGAP 9 07/05/2020   EGFR 103 03/20/2021   Lab Results  Component Value Date   CHOL 87 (L) 03/20/2021   Lab Results  Component Value Date   HDL 41 03/20/2021   Lab Results  Component Value Date   LDLCALC 30 03/20/2021   Lab Results  Component Value Date   TRIG 74 03/20/2021   Lab Results  Component Value Date   CHOLHDL 2.1 03/20/2021   Lab Results  Component Value Date   HGBA1C 5.8 (H) 07/04/2020       Assessment & Plan:   Problem List Items Addressed This Visit   None Visit Diagnoses     Fever, unspecified fever cause    -  Primary   Relevant Orders   POC COVID-19 BinaxNow (Completed)- negative covid   POCT Influenza A/B (Completed) negative influenza   Acute non-recurrent maxillary sinusitis       Relevant Medications   azithromycin (ZITHROMAX) 250 MG tablet   predniSONE (STERAPRED UNI-PAK 21 TAB) 10 MG (21) TBPK tablet Treat acute sinusitis with zpack and prednisone      Meds ordered this encounter  Medications   azithromycin (ZITHROMAX) 250 MG tablet    Sig: Take 2 tablets on day 1, then 1  tablet daily on days 2 through 5    Dispense:  6 tablet    Refill:  0   predniSONE (STERAPRED UNI-PAK 21 TAB) 10 MG (21) TBPK tablet    Sig: Take 6ills first day , then 5 pills day 2 and then cut down one pill day until gone    Dispense:  21 tablet    Refill:  0    Orders Placed This Encounter  Procedures   POC COVID-19 BinaxNow   POCT Influenza A/B     Follow-up: Return if symptoms worsen or fail to improve.  An After Visit Summary was printed and given to the patient.  Reinaldo Meeker, MD [facility] [phone]

## 2021-07-09 ENCOUNTER — Ambulatory Visit (INDEPENDENT_AMBULATORY_CARE_PROVIDER_SITE_OTHER): Payer: Managed Care, Other (non HMO) | Admitting: Podiatry

## 2021-07-09 ENCOUNTER — Encounter: Payer: Self-pay | Admitting: Podiatry

## 2021-07-09 DIAGNOSIS — M216X1 Other acquired deformities of right foot: Secondary | ICD-10-CM | POA: Diagnosis not present

## 2021-07-09 DIAGNOSIS — M216X9 Other acquired deformities of unspecified foot: Secondary | ICD-10-CM

## 2021-07-09 DIAGNOSIS — M722 Plantar fascial fibromatosis: Secondary | ICD-10-CM

## 2021-07-09 DIAGNOSIS — Z9889 Other specified postprocedural states: Secondary | ICD-10-CM

## 2021-07-09 DIAGNOSIS — M216X2 Other acquired deformities of left foot: Secondary | ICD-10-CM | POA: Diagnosis not present

## 2021-07-12 NOTE — Progress Notes (Signed)
°  Subjective:  Patient ID: Matthew Patel, male    DOB: 02-Jul-1966,  MRN: [id number]  Chief Complaint  Patient presents with   Routine Post Op    I am doing pretty good and still hurts but not as bad just takes it a little longer to ease up and worse am on the left foot    DOS: 05/02/21 Procedure: EPF, EGR, excision of skin lesion left foot  55 y.o. male presents with the above complaint. History confirmed with patient.   Objective:  Physical Exam: no tenderness at the surgical site, no edema noted, and calf supple, nontender. Nerve exam intact. POP along the medial arch. Incision: well healed.  Assessment:   1. Plantar fasciitis   2. Equinus deformity of foot   3. Post-operative state     Plan:  Patient was evaluated and treated and all questions answered.  Post-operative State -Pain more in the arch of the foot and ball of the foot than the surgical area. Will support with CMOs. Casted for [employer]. -Continue stretching and icing. -XRs needed at follow-up: none  Return in about 6 weeks (around 08/20/2021).

## 2021-08-20 ENCOUNTER — Ambulatory Visit: Payer: Managed Care, Other (non HMO) | Admitting: Podiatry

## 2021-08-20 ENCOUNTER — Encounter: Payer: Self-pay | Admitting: Podiatry

## 2021-08-20 ENCOUNTER — Other Ambulatory Visit: Payer: Self-pay

## 2021-08-20 DIAGNOSIS — Z9889 Other specified postprocedural states: Secondary | ICD-10-CM

## 2021-08-20 DIAGNOSIS — M216X9 Other acquired deformities of unspecified foot: Secondary | ICD-10-CM | POA: Diagnosis not present

## 2021-08-20 DIAGNOSIS — M722 Plantar fascial fibromatosis: Secondary | ICD-10-CM

## 2021-08-20 NOTE — Progress Notes (Signed)
°  Subjective:  Patient ID: Matthew Patel, male    DOB: [birth year],  MRN: [id number]  Chief Complaint  Patient presents with   Routine Post Op    The left heel hurts at times and pain comes and goes and no swelling and arch cramps up and there is a dull ache   DOS: 05/02/21 Procedure: EPF, EGR, excision of skin lesion left foot  56 y.o. male presents with the above complaint. History confirmed with patient.  Objective:  Physical Exam: no tenderness at the surgical site, no edema noted, and calf supple, nontender. Nerve exam intact. POP along the medial arch. No pain at plantar heel. Incision: well healed.  Assessment:   1. Plantar fasciitis   2. Equinus deformity of foot   3. Post-operative state    Plan:  Patient was evaluated and treated and all questions answered.  Post-operative State -CMOs dispensed today. Discussed break in period and proper use. -Continue stretching and icing. -XRs needed at follow-up: none  No follow-ups on file.

## 2021-08-20 NOTE — Patient Instructions (Signed)

## 2021-10-01 ENCOUNTER — Ambulatory Visit: Payer: Managed Care, Other (non HMO) | Admitting: Podiatry

## 2021-10-01 ENCOUNTER — Encounter: Payer: Self-pay | Admitting: Podiatry

## 2021-10-01 ENCOUNTER — Other Ambulatory Visit: Payer: Self-pay

## 2021-10-01 DIAGNOSIS — M722 Plantar fascial fibromatosis: Secondary | ICD-10-CM

## 2021-10-01 DIAGNOSIS — M216X9 Other acquired deformities of unspecified foot: Secondary | ICD-10-CM

## 2021-10-01 DIAGNOSIS — Z9889 Other specified postprocedural states: Secondary | ICD-10-CM

## 2021-10-01 NOTE — Progress Notes (Signed)
?  Subjective:  ?Patient ID: Matthew Patel, male    DOB: 1966/02/29,  MRN: [id number] ? ?Chief Complaint  ?Patient presents with  ? Foot Pain  ?  The left heel  is about the same and the inserts are not helping me at all   ? ?DOS: 05/02/21 ?Procedure: EPF, EGR, excision of skin lesion left foot ? ?56 y.o. male presents with continue pain in his heel arch and back of the calf. Patient frustrated with continued pain. Relates he should be better by now. Relates some new back pain.  ?Objective:  ?Physical Exam: no tenderness at the surgical site, no edema noted, and calf supple, nontender. Nerve exam intact. POP along the medial arch. No pain at plantar heel. ?Incision: well healed. ? ?Assessment:  ? ?1. Plantar fasciitis   ?2. Post-operative state   ?3. Equinus deformity of foot   ? ? ?Plan:  ?Patient was evaluated and treated and all questions answered. ?Continue with CMOs  ?Discussed trying some PT to help aid with recovered. Referral placed  ?Discussed it can take 6 months to a year to fully recover from this type of surgery.  ?Discussed back issues and that causing burning tingling pain in the legs. Suggest following with PCP  ?Follow-up after PT.  ? ?No follow-ups on file.  ? ? ?

## 2021-10-15 ENCOUNTER — Telehealth: Payer: Self-pay | Admitting: Cardiology

## 2021-10-15 NOTE — Telephone Encounter (Signed)
Pt c/o of Chest Pain: STAT if CP now or developed within 24 hours ? ?1. Are you having CP right now? Not that she is aware ? ?2. Are you experiencing any other symptoms (ex. SOB, nausea, vomiting, sweating)? Really tired and weak  ? ?3. How long have you been experiencing CP? About a month, last episode friday/Saturday of this week  ? ?4. Is your CP continuous or coming and going? Coming and going  ? ?5. Have you taken Nitroglycerin? No  ? ? ?Wife is calling reports pt is having CP and weakness. Requested appt, but there is no upcoming availability with Dr. Dulce Sellar. Patient has not taken any nitro for CP. States he had an episode at work where his boss almost sent him to the ED. Please advise.  ?

## 2021-10-15 NOTE — Telephone Encounter (Signed)
Patient's wife reported that the patient is having chest pain and feeling tired and weak. Patient has nitro and is afraid to take it. Due to the patient reporting having chest pain off and on for about a month and feeling tired and weak. I advised the patient to go to the ER. Patient's wife agreed and had no further questions. ?

## 2021-12-05 ENCOUNTER — Other Ambulatory Visit: Payer: Self-pay | Admitting: Legal Medicine

## 2022-02-12 ENCOUNTER — Ambulatory Visit: Payer: Managed Care, Other (non HMO) | Admitting: Legal Medicine

## 2022-02-12 ENCOUNTER — Encounter: Payer: Self-pay | Admitting: Legal Medicine

## 2022-02-12 VITALS — BP 124/72 | HR 69 | Resp 18 | Ht 67.0 in | Wt 155.4 lb

## 2022-02-12 DIAGNOSIS — I25118 Atherosclerotic heart disease of native coronary artery with other forms of angina pectoris: Secondary | ICD-10-CM | POA: Diagnosis not present

## 2022-02-12 DIAGNOSIS — I739 Peripheral vascular disease, unspecified: Secondary | ICD-10-CM | POA: Insufficient documentation

## 2022-02-12 DIAGNOSIS — J410 Simple chronic bronchitis: Secondary | ICD-10-CM | POA: Diagnosis not present

## 2022-02-12 DIAGNOSIS — L409 Psoriasis, unspecified: Secondary | ICD-10-CM | POA: Insufficient documentation

## 2022-02-12 DIAGNOSIS — Z1159 Encounter for screening for other viral diseases: Secondary | ICD-10-CM

## 2022-02-12 DIAGNOSIS — M170 Bilateral primary osteoarthritis of knee: Secondary | ICD-10-CM | POA: Diagnosis not present

## 2022-02-12 HISTORY — DX: Peripheral vascular disease, unspecified: I73.9

## 2022-02-12 HISTORY — DX: Psoriasis, unspecified: L40.9

## 2022-02-12 NOTE — Progress Notes (Deleted)
Acute Office Visit  Subjective:    Patient ID: Matthew Patel, male    DOB: Aug 30, 1965, 56 y.o.   MRN: [id number]  Chief Complaint  Patient presents with   Cramps in both legs   Joint Pain    HPI: Patient is in today for ***  Past Medical History:  Diagnosis Date   CAD in native artery 07/05/2020   Cardiomyopathy, ischemic 07/05/2020   GERD (gastroesophageal reflux disease)    Migraines    Mixed hyperlipidemia    S/P angioplasty with stent RCA DES 07/04/20 07/05/2020   Tobacco abuse 07/05/2020    Past Surgical History:  Procedure Laterality Date   CORONARY/GRAFT ACUTE MI REVASCULARIZATION N/A 07/04/2020   Procedure: Coronary/Graft Acute MI Revascularization;  Surgeon: Troy Sine, MD;  Location: [place] [facility];  Service: Cardiovascular;  Laterality: N/A;   LEFT HEART CATH AND CORONARY ANGIOGRAPHY N/A 07/04/2020   Procedure: LEFT HEART CATH AND CORONARY ANGIOGRAPHY;  Surgeon: Troy Sine, MD;  Location: [place] [facility];  Service: Cardiovascular;  Laterality: N/A;   LEFT HEART CATH AND CORONARY ANGIOGRAPHY N/A 02/02/2021   Procedure: LEFT HEART CATH AND CORONARY ANGIOGRAPHY;  Surgeon: Wellington Hampshire, MD;  Location: [place] [facility];  Service: Cardiovascular;  Laterality: N/A;    Family History  Problem Relation Age of Onset   Diabetes Other    Hypertension Other    Heart disease Other     Social History   Socioeconomic History   Marital status: Single    Spouse name: Not on file   Number of children: Not on file   Years of education: Not on file   Highest education level: Not on file  Occupational History   Not on file  Tobacco Use   Smoking status: Former    Packs/day: 1.50    Types: Cigarettes    Quit date: 07/04/2020    Years since quitting: 1.6   Smokeless tobacco: Never  Vaping Use   Vaping Use: Never used  Substance and Sexual Activity   Alcohol use: Never   Drug use: Never   Sexual activity: Not on file  Other Topics Concern   Not  on file  Social History Narrative   Not on file   Social Determinants of Health   Financial Resource Strain: Not on file  Food Insecurity: Not on file  Transportation Needs: Not on file  Physical Activity: Not on file  Stress: Not on file  Social Connections: Not on file  Intimate Partner Violence: Not on file    Outpatient Medications Prior to Visit  Medication Sig Dispense Refill   acetaminophen (TYLENOL) 325 MG tablet Take 2 tablets (650 mg total) by mouth every 4 (four) hours as needed for headache or mild pain.     aspirin 81 MG chewable tablet Chew 1 tablet (81 mg total) by mouth daily.     atorvastatin (LIPITOR) 40 MG tablet Take 1 tablet (40 mg total) by mouth daily. 90 tablet 3   COSENTYX SENSOREADY, 300 MG, 150 MG/ML SOAJ Inject 2 Syringes into the skin every 28 (twenty-eight) days.     fluticasone (FLONASE) 50 MCG/ACT nasal spray Place 1-2 sprays into both nostrils daily as needed for allergies or rhinitis.     Menthol, Topical Analgesic, (MINERAL ICE EX) Apply 1 application topically daily as needed (foot pain).     metoprolol succinate (TOPROL XL) 25 MG 24 hr tablet Take 0.5 tablets (12.5 mg total) by mouth daily. 45 tablet  3   nitroGLYCERIN (NITROSTAT) 0.4 MG SL tablet Place 1 tablet (0.4 mg total) under the tongue every 5 (five) minutes as needed for chest pain. 25 tablet 4   omeprazole (PRILOSEC) 40 MG capsule TAKE 1 CAPSULE BY MOUTH TWICE DAILY BEFORE A MEAL 180 capsule 0   ticagrelor (BRILINTA) 90 MG TABS tablet Take 1 tablet (90 mg total) by mouth 2 (two) times daily. 180 tablet 3   Facility-Administered Medications Prior to Visit  Medication Dose Route Frequency Provider Last Rate Last Admin   sodium chloride flush (NS) 0.9 % injection 3 mL  3 mL Intravenous Q12H Richardo Priest, MD        Allergies  Allergen Reactions   Ranitidine Swelling    Review of Systems  Constitutional:  Negative for appetite change, fatigue and fever.  HENT:  Negative for  congestion, ear pain, sinus pressure and sore throat.   Respiratory:  Negative for cough, shortness of breath and wheezing.   Cardiovascular:  Negative for chest pain and palpitations.  Gastrointestinal:  Negative for abdominal pain, constipation, diarrhea, nausea and vomiting.  Musculoskeletal:  Positive for arthralgias, back pain and myalgias. Negative for joint swelling.  Skin:  Negative for rash.  Neurological:  Positive for dizziness. Negative for weakness and headaches.  Psychiatric/Behavioral:  Negative for dysphoric mood. The patient is not nervous/anxious.        Objective:    Physical Exam Vitals reviewed.  Constitutional:      Appearance: Normal appearance. He is normal weight.  HENT:     Right Ear: Tympanic membrane normal.     Left Ear: Tympanic membrane normal.     Nose: Nose normal.  Cardiovascular:     Rate and Rhythm: Normal rate and regular rhythm.     Pulses: Normal pulses.     Heart sounds: Normal heart sounds.  Pulmonary:     Effort: Pulmonary effort is normal.     Breath sounds: Normal breath sounds.  Abdominal:     General: Bowel sounds are normal.  Neurological:     Mental Status: He is oriented to person, place, and time. Mental status is at baseline.  Psychiatric:        Mood and Affect: Mood normal.        Behavior: Behavior normal.     Ht _0  (1.702 m)   Wt 155 lb 6.4 oz (70.5 kg)   BMI 24.34 kg/m  Wt Readings from Last 3 Encounters:  02/12/22 155 lb 6.4 oz (70.5 kg)  06/20/21 147 lb (66.7 kg)  03/20/21 154 lb 3.2 oz (69.9 kg)    Health Maintenance Due  Topic Date Due   HIV Screening  Never done   Hepatitis C Screening  Never done   TETANUS/TDAP  Never done   COLONOSCOPY (Pts 45-70yr Insurance coverage will need to be confirmed)  Never done   Zoster Vaccines- Shingrix (1 of 2) Never done    There are no preventive care reminders to display for this patient.   No results found for: "TSH" Lab Results  Component Value Date    WBC 7.6 01/31/2021   HGB 14.2 01/31/2021   HCT 42.5 01/31/2021   MCV 92 01/31/2021   PLT 250 01/31/2021   Lab Results  Component Value Date   NA 140 03/20/2021   K 4.2 03/20/2021   CO2 25 03/20/2021   GLUCOSE 93 03/20/2021   BUN 13 03/20/2021   CREATININE 0.90 03/20/2021   BILITOT 0.6 03/20/2021  ALKPHOS 48 03/20/2021   AST 13 03/20/2021   ALT 21 03/20/2021   PROT 6.8 03/20/2021   ALBUMIN 4.6 03/20/2021   CALCIUM 9.6 03/20/2021   ANIONGAP 9 07/05/2020   EGFR 103 03/20/2021   Lab Results  Component Value Date   CHOL 87 (L) 03/20/2021   Lab Results  Component Value Date   HDL 41 03/20/2021   Lab Results  Component Value Date   LDLCALC 30 03/20/2021   Lab Results  Component Value Date   TRIG 74 03/20/2021   Lab Results  Component Value Date   CHOLHDL 2.1 03/20/2021   Lab Results  Component Value Date   HGBA1C 5.8 (H) 07/04/2020       Assessment & Plan:   Problem List Items Addressed This Visit   None  No orders of the defined types were placed in this encounter.   No orders of the defined types were placed in this encounter.    Follow-up: No follow-ups on file.  An After Visit Summary was printed and given to the patient.  Reinaldo Meeker, MD [facility] [phone]

## 2022-02-12 NOTE — Progress Notes (Signed)
Acute Office Visit  Subjective:    Patient ID: Matthew Patel, male    DOB: 05-Nov-1965, 56 y.o.   MRN: [id number]  Chief Complaint  Patient presents with   Joint Pain    YZT:YEUWS Patient is in today for pain on both legs since years ago, but since Saturday the pain is getting worse. Left leg cramp 20 minutes, wakes him up at night left. Joint pain.History of CAD, stopped smoking. Pain is in calves of legs occurs at night at times and upon walking and it causes him to stop.  Psoriasis on cosentyx needs testing lab  Past Medical History:  Diagnosis Date   CAD in native artery 07/05/2020   Cardiomyopathy, ischemic 07/05/2020   GERD (gastroesophageal reflux disease)    Migraines    Mixed hyperlipidemia    S/P angioplasty with stent RCA DES 07/04/20 07/05/2020   Tobacco abuse 07/05/2020    Past Surgical History:  Procedure Laterality Date   CORONARY/GRAFT ACUTE MI REVASCULARIZATION N/A 07/04/2020   Procedure: Coronary/Graft Acute MI Revascularization;  Surgeon: Lennette Bihari, MD;  Location: [facility];  Service: Cardiovascular;  Laterality: N/A;   LEFT HEART CATH AND CORONARY ANGIOGRAPHY N/A 07/04/2020   Procedure: LEFT HEART CATH AND CORONARY ANGIOGRAPHY;  Surgeon: Lennette Bihari, MD;  Location: [facility];  Service: Cardiovascular;  Laterality: N/A;   LEFT HEART CATH AND CORONARY ANGIOGRAPHY N/A 02/02/2021   Procedure: LEFT HEART CATH AND CORONARY ANGIOGRAPHY;  Surgeon: Iran Ouch, MD;  Location: [facility];  Service: Cardiovascular;  Laterality: N/A;    Family History  Problem Relation Age of Onset   Diabetes Other    Hypertension Other    Heart disease Other     Social History   Socioeconomic History   Marital status: Single    Spouse name: Not on file   Number of children: Not on file   Years of education: Not on file   Highest education level: Not on file  Occupational History   Not on file  Tobacco Use   Smoking status: Former     Packs/day: 1.50    Types: Cigarettes    Quit date: 07/04/2020    Years since quitting: 1.6   Smokeless tobacco: Never  Vaping Use   Vaping Use: Never used  Substance and Sexual Activity   Alcohol use: Never   Drug use: Never   Sexual activity: Not on file  Other Topics Concern   Not on file  Social History Narrative   Not on file   Social Determinants of Health   Financial Resource Strain: Not on file  Food Insecurity: Not on file  Transportation Needs: Not on file  Physical Activity: Not on file  Stress: Not on file  Social Connections: Not on file  Intimate Partner Violence: Not on file    Outpatient Medications Prior to Visit  Medication Sig Dispense Refill   acetaminophen (TYLENOL) 325 MG tablet Take 2 tablets (650 mg total) by mouth every 4 (four) hours as needed for headache or mild pain.     aspirin 81 MG chewable tablet Chew 1 tablet (81 mg total) by mouth daily.     atorvastatin (LIPITOR) 40 MG tablet Take 1 tablet (40 mg total) by mouth daily. 90 tablet 3   COSENTYX SENSOREADY, 300 MG, 150 MG/ML SOAJ Inject 2 Syringes into the skin every 28 (twenty-eight) days.     fluticasone (FLONASE) 50 MCG/ACT nasal spray Place 1-2 sprays into  both nostrils daily as needed for allergies or rhinitis.     Menthol, Topical Analgesic, (MINERAL ICE EX) Apply 1 application topically daily as needed (foot pain).     metoprolol succinate (TOPROL XL) 25 MG 24 hr tablet Take 0.5 tablets (12.5 mg total) by mouth daily. 45 tablet 3   nitroGLYCERIN (NITROSTAT) 0.4 MG SL tablet Place 1 tablet (0.4 mg total) under the tongue every 5 (five) minutes as needed for chest pain. 25 tablet 4   omeprazole (PRILOSEC) 40 MG capsule TAKE 1 CAPSULE BY MOUTH TWICE DAILY BEFORE A MEAL 180 capsule 0   ticagrelor (BRILINTA) 90 MG TABS tablet Take 1 tablet (90 mg total) by mouth 2 (two) times daily. 180 tablet 3   Facility-Administered Medications Prior to Visit  Medication Dose Route Frequency Provider Last  Rate Last Admin   sodium chloride flush (NS) 0.9 % injection 3 mL  3 mL Intravenous Q12H Richardo Priest, MD        Allergies  Allergen Reactions   Ranitidine Swelling    Review of Systems  Constitutional:  Negative for chills, fatigue, fever and unexpected weight change.  HENT:  Negative for congestion, ear pain, sinus pain and sore throat.   Eyes:  Negative for visual disturbance.  Respiratory:  Negative for cough and shortness of breath.   Cardiovascular:  Negative for chest pain and palpitations.  Gastrointestinal:  Negative for abdominal pain, blood in stool, constipation, diarrhea, nausea and vomiting.  Endocrine: Negative for polydipsia.  Genitourinary:  Negative for dysuria.  Musculoskeletal:  Negative for back pain.  Skin:  Negative for rash.  Neurological:  Negative for headaches.  Psychiatric/Behavioral: Negative.         Objective:    Physical Exam Vitals reviewed.  Constitutional:      General: He is not in acute distress.    Appearance: Normal appearance.  HENT:     Head: Normocephalic and atraumatic.     Right Ear: Tympanic membrane normal.     Left Ear: Tympanic membrane normal.     Nose: Nose normal.     Mouth/Throat:     Mouth: Mucous membranes are dry.     Pharynx: Oropharynx is clear.  Eyes:     Extraocular Movements: Extraocular movements intact.     Conjunctiva/sclera: Conjunctivae normal.     Pupils: Pupils are equal, round, and reactive to light.  Cardiovascular:     Rate and Rhythm: Normal rate and regular rhythm.     Pulses: Normal pulses.     Heart sounds: Normal heart sounds. No murmur heard.    No gallop.  Pulmonary:     Effort: Pulmonary effort is normal. No respiratory distress.     Breath sounds: No stridor. No wheezing or rhonchi.  Abdominal:     General: Abdomen is flat. Bowel sounds are normal. There is no distension.     Tenderness: There is no abdominal tenderness.  Musculoskeletal:        General: Tenderness (oa both knees)  present.     Comments: Crepitation on ROM both knees  Skin:    General: Skin is warm.     Capillary Refill: Capillary refill takes 2 to 3 seconds.  Neurological:     General: No focal deficit present.     Mental Status: He is alert and oriented to person, place, and time. Mental status is at baseline.     Sensory: No sensory deficit.     Motor: No weakness.  Gait: Gait normal.  Psychiatric:        Mood and Affect: Mood normal.        Thought Content: Thought content normal.     BP 124/72   Pulse 69   Resp 18   Ht $R'5\' 7"'sh$  (1.702 m)   Wt 155 lb 6.4 oz (70.5 kg)   SpO2 98%   BMI 24.34 kg/m  Wt Readings from Last 3 Encounters:  02/12/22 155 lb 6.4 oz (70.5 kg)  06/20/21 147 lb (66.7 kg)  03/20/21 154 lb 3.2 oz (69.9 kg)    Health Maintenance Due  Topic Date Due   HIV Screening  Never done   Hepatitis C Screening  Never done   TETANUS/TDAP  Never done   COLONOSCOPY (Pts 45-25yrs Insurance coverage will need to be confirmed)  Never done   Zoster Vaccines- Shingrix (1 of 2) Never done       No results found for: "TSH" Lab Results  Component Value Date   WBC 7.6 01/31/2021   HGB 14.2 01/31/2021   HCT 42.5 01/31/2021   MCV 92 01/31/2021   PLT 250 01/31/2021   Lab Results  Component Value Date   NA 140 03/20/2021   K 4.2 03/20/2021   CO2 25 03/20/2021   GLUCOSE 93 03/20/2021   BUN 13 03/20/2021   CREATININE 0.90 03/20/2021   BILITOT 0.6 03/20/2021   ALKPHOS 48 03/20/2021   AST 13 03/20/2021   ALT 21 03/20/2021   PROT 6.8 03/20/2021   ALBUMIN 4.6 03/20/2021   CALCIUM 9.6 03/20/2021   ANIONGAP 9 07/05/2020   EGFR 103 03/20/2021   Lab Results  Component Value Date   CHOL 87 (L) 03/20/2021   Lab Results  Component Value Date   HDL 41 03/20/2021   Lab Results  Component Value Date   LDLCALC 30 03/20/2021   Lab Results  Component Value Date   TRIG 74 03/20/2021   Lab Results  Component Value Date   CHOLHDL 2.1 03/20/2021   Lab Results   Component Value Date   HGBA1C 5.8 (H) 07/04/2020       Assessment & Plan:   Problem List Items Addressed This Visit       Cardiovascular and Mediastinum   Atherosclerotic heart disease of native coronary artery with other forms of angina pectoris ([place]) An individual plan was formulated based on patient history and exam, labs and evidence based data. Patient has not had recent angina or nitroglycerin use. continue present treatment.    PAD (peripheral artery disease) ([place]) Patient is getting intermittent claudication symptoms in legs and at night- needs vasculat workup, poor capillary filling in toes     Respiratory   COPD (chronic obstructive pulmonary disease) ([place]) An individualize plan was formulated for care of COPD.  Treatment is evidence based.  She will continue on inhalers, avoid smoking and smoke.  Regular exercise with help with dyspnea. Routine follow ups and medication compliance is needed.  He has stopped smoking     Musculoskeletal and Integument   Psoriasis Treated by Dr. Michele Mcalpine with dmard   Other Visit Diagnoses     Primary osteoarthritis of both knees    -  Primary   Relevant Orders   DG Knee Bilateral Standing AP Crepitation both knees, get x-rays        Orders Placed This Encounter  Procedures   DG Knee Bilateral Standing AP   CBC with Differential/Platelet   Comprehensive metabolic panel   Hepatitis C Antibody  QuantiFERON-TB Gold Plus   Ambulatory referral to Vascular Surgery     Follow-up: Return in about 3 months (around 05/15/2022).  An After Visit Summary was printed and given to the patient.  Reinaldo Meeker, MD [facility] [phone]

## 2022-02-13 LAB — CBC WITH DIFFERENTIAL/PLATELET
Basophils Absolute: 0.1 10*3/uL (ref 0.0–0.2)
Basos: 1 %
EOS (ABSOLUTE): 0.2 10*3/uL (ref 0.0–0.4)
Eos: 4 %
Hematocrit: 41.1 % (ref 37.5–51.0)
Hemoglobin: 14 g/dL (ref 13.0–17.7)
Immature Grans (Abs): 0 10*3/uL (ref 0.0–0.1)
Immature Granulocytes: 0 %
Lymphocytes Absolute: 2.4 10*3/uL (ref 0.7–3.1)
Lymphs: 38 %
MCH: 30.9 pg (ref 26.6–33.0)
MCHC: 34.1 g/dL (ref 31.5–35.7)
MCV: 91 fL (ref 79–97)
Monocytes Absolute: 0.5 10*3/uL (ref 0.1–0.9)
Monocytes: 8 %
Neutrophils Absolute: 3.2 10*3/uL (ref 1.4–7.0)
Neutrophils: 49 %
Platelets: 241 10*3/uL (ref 150–450)
RBC: 4.53 x10E6/uL (ref 4.14–5.80)
RDW: 12.3 % (ref 11.6–15.4)
WBC: 6.4 10*3/uL (ref 3.4–10.8)

## 2022-02-13 LAB — COMPREHENSIVE METABOLIC PANEL
ALT: 21 IU/L (ref 0–44)
AST: 21 IU/L (ref 0–40)
Albumin/Globulin Ratio: 1.7 (ref 1.2–2.2)
Albumin: 4.5 g/dL (ref 3.8–4.9)
Alkaline Phosphatase: 60 IU/L (ref 44–121)
BUN/Creatinine Ratio: 14 (ref 9–20)
BUN: 13 mg/dL (ref 6–24)
Bilirubin Total: 0.8 mg/dL (ref 0.0–1.2)
CO2: 23 mmol/L (ref 20–29)
Calcium: 9.4 mg/dL (ref 8.7–10.2)
Chloride: 101 mmol/L (ref 96–106)
Creatinine, Ser: 0.95 mg/dL (ref 0.76–1.27)
Globulin, Total: 2.6 g/dL (ref 1.5–4.5)
Glucose: 103 mg/dL — ABNORMAL HIGH (ref 70–99)
Potassium: 4.4 mmol/L (ref 3.5–5.2)
Sodium: 137 mmol/L (ref 134–144)
Total Protein: 7.1 g/dL (ref 6.0–8.5)
eGFR: 96 mL/min/{1.73_m2} (ref >59)

## 2022-02-13 LAB — HEPATITIS C ANTIBODY: Hep C Virus Ab: NONREACTIVE

## 2022-02-13 NOTE — Progress Notes (Signed)
Glucose 103, kidney tests normal, liver tests normal, CBC normal, Hepatitis c negative lp

## 2022-02-15 LAB — QUANTIFERON-TB GOLD PLUS
QuantiFERON Mitogen Value: >10 IU/mL
QuantiFERON Nil Value: 0.02 IU/mL
QuantiFERON TB1 Ag Value: 0.4 IU/mL
QuantiFERON TB2 Ag Value: 0.63 IU/mL
QuantiFERON-TB Gold Plus: POSITIVE — AB

## 2022-02-17 NOTE — Progress Notes (Signed)
  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

## 2022-02-19 ENCOUNTER — Other Ambulatory Visit: Payer: Self-pay

## 2022-02-19 DIAGNOSIS — R7612 Nonspecific reaction to cell mediated immunity measurement of gamma interferon antigen response without active tuberculosis: Secondary | ICD-10-CM

## 2022-02-20 NOTE — Progress Notes (Signed)
Chest x-ray order sent lp

## 2022-02-21 ENCOUNTER — Other Ambulatory Visit: Payer: Self-pay

## 2022-02-21 DIAGNOSIS — R7612 Nonspecific reaction to cell mediated immunity measurement of gamma interferon antigen response without active tuberculosis: Secondary | ICD-10-CM

## 2022-02-25 ENCOUNTER — Other Ambulatory Visit: Payer: Self-pay | Admitting: Legal Medicine

## 2022-03-18 ENCOUNTER — Other Ambulatory Visit: Payer: Self-pay

## 2022-03-18 MED ORDER — OMEPRAZOLE 40 MG PO CPDR: DELAYED_RELEASE_CAPSULE | ORAL | 1 refills | Status: DC

## 2022-03-28 ENCOUNTER — Ambulatory Visit: Payer: Managed Care, Other (non HMO) | Admitting: Vascular Surgery

## 2022-03-28 ENCOUNTER — Encounter: Payer: Self-pay | Admitting: Vascular Surgery

## 2022-03-28 ENCOUNTER — Other Ambulatory Visit (HOSPITAL_COMMUNITY): Payer: Self-pay | Admitting: Vascular Surgery

## 2022-03-28 ENCOUNTER — Ambulatory Visit (HOSPITAL_COMMUNITY)
Admission: RE | Admit: 2022-03-28 | Discharge: 2022-03-28 | Disposition: A | Discharge status: Home / Self Care | Payer: Managed Care, Other (non HMO) | Source: Ambulatory Visit | Attending: Vascular Surgery | Admitting: Vascular Surgery

## 2022-03-28 VITALS — BP 122/82 | HR 69 | Temp 99.1°F | Resp 20 | Ht 67.0 in | Wt 158.0 lb

## 2022-03-28 DIAGNOSIS — I70219 Atherosclerosis of native arteries of extremities with intermittent claudication, unspecified extremity: Secondary | ICD-10-CM

## 2022-03-28 DIAGNOSIS — I739 Peripheral vascular disease, unspecified: Secondary | ICD-10-CM

## 2022-03-28 NOTE — Progress Notes (Signed)
ASSESSMENT & PLAN   PERIPHERAL ARTERIAL DISEASE: This patient has stable claudication of the left calf.  I do not get any history of rest pain or nonhealing ulcers.  He likely has significant infrainguinal arterial occlusive disease on the left.  We have discussed the importance of a structured walking program and how this promotes the development of collateral flow.  I have made it clear that it is really important that he stay off of the cigarettes which she quit in 2021.  We have also discussed the importance of nutrition.  I favor a largely plant-based diet to prevent progression of atherosclerosis.  If his symptoms progress then certainly we could consider arteriography and intervention.  However he is quite young and I like to avoid intervening and less he develops progressive ischemia.  If he ultimately required a bypass he would require preoperative cardiac evaluation given his cardiac history.  He seems intelligent and motivated so I think we can do well with conservative treatment.  I will see him back in 9 months.  He knows to call sooner if he has problems.  I ordered follow-up ABIs at that time.  REASON FOR CONSULT:    Peripheral arterial disease.  The consult is requested by Dr. Marina Goodell.  HPI:   Matthew Patel is a 56 y.o. male who is referred for evaluation of peripheral arterial disease.  I have reviewed the records from the referring office.  The patient was seen by Dr. Marina Goodell on 02/12/2022.  He was describing some symptoms consistent with claudication.   On my history, the patient describes calf claudication bilaterally.  His symptoms are more significant on the left side.  He gets pain in his calf which is brought on by ambulation and relieved with rest.  The distance varies.  I do not get any clear-cut history of rest pain.  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  He  has no history of nonhealing ulcers.  His risk factors for peripheral arterial disease include hyperlipidemia and history of tobacco use.  He quit smoking in 2021.  He denies any history of diabetes, hypertension, or family history of premature cardiovascular disease.  He does have some cardiac issues.  He has a history of ischemic cardiomyopathy.  He had a previous PTCA in 2021.  His cardiologist is Dr. Dulce Sellar.  He also has COPD.  Past Medical History:  Diagnosis Date   CAD in native artery 07/05/2020   Cardiomyopathy, ischemic 07/05/2020   GERD (gastroesophageal reflux disease)    Migraines    Mixed hyperlipidemia    S/P angioplasty with stent RCA DES 07/04/20 07/05/2020   Tobacco abuse 07/05/2020    Family History  Problem Relation Age of Onset   Diabetes Other    Hypertension Other    Heart disease Other     SOCIAL HISTORY: Social History   Tobacco Use   Smoking status: Former    Packs/day: 1.50    Types: Cigarettes    Quit date: 07/04/2020    Years since quitting: 1.7   Smokeless tobacco: Never  Substance Use Topics   Alcohol use: Never    Allergies  Allergen Reactions   Ranitidine Swelling    Current Outpatient Medications  Medication Sig Dispense Refill   acetaminophen (TYLENOL) 325 MG tablet Take 2 tablets (650 mg total) by mouth every 4 (four) hours as needed  for headache or mild pain.     aspirin 81 MG chewable tablet Chew 1 tablet (81 mg total) by mouth daily.     COSENTYX SENSOREADY, 300 MG, 150 MG/ML SOAJ Inject 2 Syringes into the skin every 28 (twenty-eight) days.     fluticasone (FLONASE) 50 MCG/ACT nasal spray Place 1-2 sprays into both nostrils daily as needed for allergies or rhinitis.     Menthol, Topical Analgesic, (MINERAL ICE EX) Apply 1 application topically daily as needed (foot pain).     metoprolol succinate (TOPROL XL) 25 MG 24 hr tablet Take 0.5 tablets (12.5 mg total) by mouth daily. 45 tablet 3   omeprazole (PRILOSEC) 40 MG capsule TAKE 1  CAPSULE BY MOUTH TWICE DAILY BEFORE A MEAL 180 capsule 1   atorvastatin (LIPITOR) 40 MG tablet Take 1 tablet (40 mg total) by mouth daily. 90 tablet 3   nitroGLYCERIN (NITROSTAT) 0.4 MG SL tablet Place 1 tablet (0.4 mg total) under the tongue every 5 (five) minutes as needed for chest pain. (Patient not taking: Reported on 03/28/2022) 25 tablet 4   Current Facility-Administered Medications  Medication Dose Route Frequency Provider Last Rate Last Admin   sodium chloride flush (NS) 0.9 % injection 3 mL  3 mL Intravenous Q12H Baldo Daub, MD        REVIEW OF SYSTEMS:  [X]  denotes positive finding, [ ]  denotes negative finding Cardiac  Comments:  Chest pain or chest pressure: x   Shortness of breath upon exertion:    Short of breath when lying flat:    Irregular heart rhythm: x       Vascular    Pain in calf, thigh, or hip brought on by ambulation: x   Pain in feet at night that wakes you up from your sleep:     Blood clot in your veins:    Leg swelling:         Pulmonary    Oxygen at home:    Productive cough:     Wheezing:         Neurologic    Sudden weakness in arms or legs:     Sudden numbness in arms or legs:     Sudden onset of difficulty speaking or slurred speech:    Temporary loss of vision in one eye:     Problems with dizziness:         Gastrointestinal    Blood in stool:     Vomited blood:         Genitourinary    Burning when urinating:     Blood in urine:        Psychiatric    Major depression:         Hematologic    Bleeding problems:    Problems with blood clotting too easily:        Skin    Rashes or ulcers:        Constitutional    Fever or chills:    -  PHYSICAL EXAM:   Vitals:   03/28/22 0843  BP: 122/82  Pulse: 69  Resp: 20  Temp: 99.1 F (37.3 C)  SpO2: 97%  Weight: 158 lb (71.7 kg)  Height: 5\' 7"  (1.702 m)   Body mass index is 24.75 kg/m. GENERAL: The patient is a well-nourished male, in no acute distress. The vital signs  are documented above. CARDIAC: There is a regular rate and rhythm.  VASCULAR: I do not detect carotid bruits. On the  right side he has a palpable femoral and popliteal pulse.  I cannot palpate pedal pulses. On the left side he has a palpable femoral pulse.  I cannot palpate popliteal or pedal pulses. He has no significant lower extremity swelling. PULMONARY: There is good air exchange bilaterally without wheezing or rales. ABDOMEN: Soft and non-tender with normal pitched bowel sounds.  I do not palpate an aneurysm. MUSCULOSKELETAL: There are no major deformities. NEUROLOGIC: No focal weakness or paresthesias are detected. SKIN: There are no ulcers or rashes noted. PSYCHIATRIC: The patient has a normal affect.  DATA:    ARTERIAL DOPPLER STUDY: I have independently interpreted his arterial Doppler study today.  On the right side there is a monophasic posterior tibial signal.  There is a biphasic dorsalis pedis signal.  ABIs 100%.  Toe pressure is 98 mmHg.  On the left side we could not obtain a posterior tibial signal.  There was a monophasic dorsalis pedis signal.  ABI was 42%.  Toe pressure 53 mmHg.  Waverly Ferrari [facility]

## 2022-04-04 ENCOUNTER — Other Ambulatory Visit: Payer: Self-pay

## 2022-04-04 DIAGNOSIS — I70219 Atherosclerosis of native arteries of extremities with intermittent claudication, unspecified extremity: Secondary | ICD-10-CM

## 2022-04-04 DIAGNOSIS — I739 Peripheral vascular disease, unspecified: Secondary | ICD-10-CM

## 2022-04-08 ENCOUNTER — Encounter: Payer: Self-pay | Admitting: Cardiology

## 2022-04-08 ENCOUNTER — Ambulatory Visit: Payer: Managed Care, Other (non HMO) | Attending: Cardiology | Admitting: Cardiology

## 2022-04-08 VITALS — BP 122/82 | HR 68 | Ht 67.0 in | Wt 162.0 lb

## 2022-04-08 DIAGNOSIS — Z720 Tobacco use: Secondary | ICD-10-CM | POA: Diagnosis not present

## 2022-04-08 DIAGNOSIS — I739 Peripheral vascular disease, unspecified: Secondary | ICD-10-CM

## 2022-04-08 DIAGNOSIS — E782 Mixed hyperlipidemia: Secondary | ICD-10-CM

## 2022-04-08 DIAGNOSIS — I2510 Atherosclerotic heart disease of native coronary artery without angina pectoris: Secondary | ICD-10-CM | POA: Diagnosis not present

## 2022-04-08 NOTE — Addendum Note (Signed)
Addended by: Roxanne Mins I on: 04/08/2022 01:57 PM   Modules accepted: Orders

## 2022-04-08 NOTE — Patient Instructions (Signed)
Medication Instructions:  Your physician recommends that you continue on your current medications as directed. Please refer to the Current Medication list given to you today.  *If you need a refill on your cardiac medications before your next appointment, please call your pharmacy*   Lab Work: Your physician recommends that you return for lab work in:   Labs today in [address]: CMP, Lipid  If you have labs (blood work) drawn today and your tests are completely normal, you will receive your results only by: MyChart Message (if you have MyChart) OR A paper copy in the mail If you have any lab test that is abnormal or we need to change your treatment, we will call you to review the results.   Testing/Procedures: None   Follow-Up: At [facility], you and your health needs are our priority.  As part of our continuing mission to provide you with exceptional heart care, we have created designated Provider Care Teams.  These Care Teams include your primary Cardiologist (physician) and Advanced Practice Providers (APPs -  Physician Assistants and Nurse Practitioners) who all work together to provide you with the care you need, when you need it.  We recommend signing up for the patient portal called "MyChart".  Sign up information is provided on this After Visit Summary.  MyChart is used to connect with patients for Virtual Visits (Telemedicine).  Patients are able to view lab/test results, encounter notes, upcoming appointments, etc.  Non-urgent messages can be sent to your provider as well.   To learn more about what you can do with MyChart, go to [email or web address].    Your next appointment:   9 month(s)  The format for your next appointment:   In Person  Provider:   Norman Herrlich, MD    Other Instructions None  Important Information About Sugar

## 2022-04-08 NOTE — Progress Notes (Signed)
Cardiology Office Note:    Date:  04/08/2022   ID:  Matthew Patel, DOB Nov 02, 1965, MRN [id number]  PCP:  Abigail Miyamoto, MD  Cardiologist:  Norman Herrlich, MD    Referring MD: Abigail Miyamoto,*    ASSESSMENT:    1. CAD in native artery   2. Mixed hyperlipidemia   3. Intermittent claudication (HCC)   4. Tobacco abuse    PLAN:    In order of problems listed above:  Per cardiology perspective doing well repeat coronary angiography showed no evidence of restenosis with progressive CAD his ejection fraction normalized following his initial presentation.  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  Concerns regarding medicines are outlined above.  No orders of the defined types were placed in this encounter.  No orders of the defined types were placed in this encounter.   Chief Complaint  Patient presents with   Follow-up   Coronary Artery Disease    History of Present Illness:    Matthew Patel is a 56 y.o. male with a hx of CAD with ST elevation MI and PCI and stent drug-eluting right coronary artery February 2021.  His course was complicated by reperfusion mediated hypotension idioventricular rhythm requiring fluid resuscitation IV pressors Levophed and IV amiodarone for heart rate control.  His initial echocardiogram showed an EF of 40 to 45% and developed symptomatic hypotension with carvedilol and ARB.  He was last seen 03/20/2021.  He had undergone repeat coronary angiography showed widely patent stent to the  right coronary artery without restenosis mild nonobstructive disease otherwise and normal left ventricular end-diastolic pressure.  Compliance with diet, lifestyle and medications: Yes  He is primarily limited by left lower extremity claudication and noninvasive studies showed severe reduction in ABI on the left.  He has been seen by vascular surgery.  Infrequently has chest discomfort but has not needed nitroglycerin He finds himself fatigued frequently No palpitation edema shortness of breath or syncope He tolerates his statin without muscle pain or weakness Past Medical History:  Diagnosis Date   CAD in native artery 07/05/2020   Cardiomyopathy, ischemic 07/05/2020   GERD (gastroesophageal reflux disease)    Migraines    Mixed hyperlipidemia    S/P angioplasty with stent RCA DES 07/04/20 07/05/2020   Tobacco abuse 07/05/2020    Past Surgical History:  Procedure Laterality Date   CORONARY/GRAFT ACUTE MI REVASCULARIZATION N/A 07/04/2020   Procedure: Coronary/Graft Acute MI Revascularization;  Surgeon: Lennette Bihari, MD;  Location: [facility] INVASIVE CV LAB;  Service: Cardiovascular;  Laterality: N/A;   LEFT HEART CATH AND CORONARY ANGIOGRAPHY N/A 07/04/2020   Procedure: LEFT HEART CATH AND CORONARY ANGIOGRAPHY;  Surgeon: Lennette Bihari, MD;  Location: [facility] INVASIVE CV LAB;  Service: Cardiovascular;  Laterality: N/A;   LEFT HEART CATH AND CORONARY ANGIOGRAPHY N/A 02/02/2021   Procedure: LEFT HEART CATH AND CORONARY ANGIOGRAPHY;  Surgeon: Iran Ouch, MD;  Location: [facility] INVASIVE CV LAB;  Service: Cardiovascular;  Laterality: N/A;    Current Medications: Current Meds  Medication Sig   acetaminophen (TYLENOL) 325 MG tablet Take 2 tablets (  650 mg total) by mouth every 4 (four) hours as needed for headache or mild pain.   aspirin 81 MG chewable tablet Chew 1 tablet (81 mg total) by mouth daily.   atorvastatin (LIPITOR) 40 MG tablet Take 1 tablet (40 mg total) by mouth daily.   COSENTYX  SENSOREADY, 300 MG, 150 MG/ML SOAJ Inject 2 Syringes into the skin every 28 (twenty-eight) days.   fluticasone (FLONASE) 50 MCG/ACT nasal spray Place 1-2 sprays into both nostrils daily as needed for allergies or rhinitis.   Menthol, Topical Analgesic, (MINERAL ICE EX) Apply 1 application topically daily as needed (foot pain).   metoprolol succinate (TOPROL XL) 25 MG 24 hr tablet Take 0.5 tablets (12.5 mg total) by mouth daily.   nitroGLYCERIN (NITROSTAT) 0.4 MG SL tablet Place 1 tablet (0.4 mg total) under the tongue every 5 (five) minutes as needed for chest pain.   omeprazole (PRILOSEC) 40 MG capsule TAKE 1 CAPSULE BY MOUTH TWICE DAILY BEFORE A MEAL   Current Facility-Administered Medications for the 04/08/22 encounter (Office Visit) with Baldo Daub, MD  Medication   sodium chloride flush (NS) 0.9 % injection 3 mL     Allergies:   Ranitidine   Social History   Socioeconomic History   Marital status: Single    Spouse name: Not on file   Number of children: Not on file   Years of education: Not on file   Highest education level: Not on file  Occupational History   Not on file  Tobacco Use   Smoking status: Former    Packs/day: 1.50    Types: Cigarettes    Quit date: 07/04/2020    Years since quitting: 1.7   Smokeless tobacco: Never  Vaping Use   Vaping Use: Never used  Substance and Sexual Activity   Alcohol use: Never   Drug use: Never   Sexual activity: Not on file  Other Topics Concern   Not on file  Social History Narrative   Not on file   Social Determinants of Health   Financial Resource Strain: Not on file  Food Insecurity: Not on file  Transportation Needs: Not on file  Physical Activity: Not on file  Stress: Not on file  Social Connections: Not on file     Family History: The patient's family history includes Diabetes in an other family member; Heart disease in an other family member; Hypertension in an other family member. ROS:   Please see the  history of present illness.    All other systems reviewed and are negative.  EKGs/Labs/Other Studies Reviewed:    The following studies were reviewed today:  EKG:  EKG ordered today and personally reviewed.  The ekg ordered today demonstrates sinus rhythm 68 bpm otherwise normal EKG  Recent Labs: 02/12/2022: ALT 21; BUN 13; Creatinine, Ser 0.95; Hemoglobin 14.0; Platelets 241; Potassium 4.4; Sodium 137  Recent Lipid Panel    Component Value Date/Time   CHOL 87 (L) 03/20/2021 0906   TRIG 74 03/20/2021 0906   HDL 41 03/20/2021 0906   CHOLHDL 2.1 03/20/2021 0906   CHOLHDL 5.7 07/04/2020 0200   VLDL 35 07/04/2020 0200   LDLCALC 30 03/20/2021 0906    Physical Exam:    VS:  BP 122/82 (BP Location: Left Arm, Patient Position: Sitting)   Pulse 68   Ht 5\' 7"  (1.702 m)   Wt 162 lb (73.5 kg)   SpO2 96%   BMI 25.37 kg/m     Wt Readings from Last 3  Encounters:  04/08/22 162 lb (73.5 kg)  03/28/22 158 lb (71.7 kg)  02/12/22 155 lb 6.4 oz (70.5 kg)     GEN:  Well nourished, well developed in no acute distress HEENT: Normal NECK: No JVD; No carotid bruits LYMPHATICS: No lymphadenopathy CARDIAC: RRR, no murmurs, rubs, gallops RESPIRATORY:  Clear to auscultation without rales, wheezing or rhonchi  ABDOMEN: Soft, non-tender, non-distended MUSCULOSKELETAL:  No edema; No deformity  SKIN: Warm and dry NEUROLOGIC:  Alert and oriented x 3 PSYCHIATRIC:  Normal affect    Signed, Norman Herrlich, MD  04/08/2022 1:46 PM     [facility]

## 2022-04-09 LAB — LIPID PANEL
Chol/HDL Ratio: 2.8 ratio (ref 0.0–5.0)
Cholesterol, Total: 111 mg/dL (ref 100–199)
HDL: 40 mg/dL (ref >39)
LDL Chol Calc (NIH): 52 mg/dL (ref 0–99)
Triglycerides: 101 mg/dL (ref 0–149)
VLDL Cholesterol Cal: 19 mg/dL (ref 5–40)

## 2022-04-09 LAB — COMPREHENSIVE METABOLIC PANEL
ALT: 24 IU/L (ref 0–44)
AST: 16 IU/L (ref 0–40)
Albumin/Globulin Ratio: 2.3 — ABNORMAL HIGH (ref 1.2–2.2)
Albumin: 5 g/dL — ABNORMAL HIGH (ref 3.8–4.9)
Alkaline Phosphatase: 61 IU/L (ref 44–121)
BUN/Creatinine Ratio: 11 (ref 9–20)
BUN: 10 mg/dL (ref 6–24)
Bilirubin Total: 0.9 mg/dL (ref 0.0–1.2)
CO2: 24 mmol/L (ref 20–29)
Calcium: 9.8 mg/dL (ref 8.7–10.2)
Chloride: 103 mmol/L (ref 96–106)
Creatinine, Ser: 0.92 mg/dL (ref 0.76–1.27)
Globulin, Total: 2.2 g/dL (ref 1.5–4.5)
Glucose: 89 mg/dL (ref 70–99)
Potassium: 4.1 mmol/L (ref 3.5–5.2)
Sodium: 143 mmol/L (ref 134–144)
Total Protein: 7.2 g/dL (ref 6.0–8.5)
eGFR: 99 mL/min/{1.73_m2} (ref >59)

## 2022-04-26 ENCOUNTER — Other Ambulatory Visit: Payer: Self-pay | Admitting: Legal Medicine

## 2022-05-17 ENCOUNTER — Other Ambulatory Visit: Payer: Self-pay | Admitting: Cardiology

## 2022-05-17 NOTE — Telephone Encounter (Signed)
Rx refill sent to pharmacy. 

## 2022-06-26 ENCOUNTER — Ambulatory Visit: Payer: Managed Care, Other (non HMO) | Admitting: Physician Assistant

## 2022-06-26 ENCOUNTER — Encounter: Payer: Self-pay | Admitting: Physician Assistant

## 2022-06-26 VITALS — BP 138/90 | HR 70 | Temp 97.4°F | Ht 68.0 in | Wt 169.2 lb

## 2022-06-26 DIAGNOSIS — J069 Acute upper respiratory infection, unspecified: Secondary | ICD-10-CM

## 2022-06-26 LAB — POC COVID19 BINAXNOW: SARS Coronavirus 2 Ag: NEGATIVE

## 2022-06-26 MED ORDER — AMOXICILLIN-POT CLAVULANATE 875-125 MG PO TABS: 1.0000 | ORAL_TABLET | Freq: Two times a day (BID) | ORAL | 0 refills | Status: DC

## 2022-06-26 MED ORDER — HYDROCODONE BIT-HOMATROP MBR 5-1.5 MG/5ML PO SOLN: 5.0000 mL | Freq: Four times a day (QID) | ORAL | 0 refills | Status: DC | PRN

## 2022-06-26 NOTE — Progress Notes (Signed)
Acute Office Visit  Subjective:    Patient ID: Matthew Patel, male    DOB: 1966/03/28, 56 y.o.   MRN: [id number]  Chief Complaint  Patient presents with   Sore Throat    COngestion    HPI: Patient is in today for complaints of cough, congestion that started about a week ago.  Complains of sore throat, productive cough, ears stopped up - has been taking some otc meds - seems to be getting worse   Past Medical History:  Diagnosis Date   CAD in native artery 07/05/2020   Cardiomyopathy, ischemic 07/05/2020   GERD (gastroesophageal reflux disease)    Migraines    Mixed hyperlipidemia    S/P angioplasty with stent RCA DES 07/04/20 07/05/2020   Tobacco abuse 07/05/2020    Past Surgical History:  Procedure Laterality Date   CORONARY/GRAFT ACUTE MI REVASCULARIZATION N/A 07/04/2020   Procedure: Coronary/Graft Acute MI Revascularization;  Surgeon: Troy Sine, MD;  Location: [place] CV LAB;  Service: Cardiovascular;  Laterality: N/A;   LEFT HEART CATH AND CORONARY ANGIOGRAPHY N/A 07/04/2020   Procedure: LEFT HEART CATH AND CORONARY ANGIOGRAPHY;  Surgeon: Troy Sine, MD;  Location: [place] CV LAB;  Service: Cardiovascular;  Laterality: N/A;   LEFT HEART CATH AND CORONARY ANGIOGRAPHY N/A 02/02/2021   Procedure: LEFT HEART CATH AND CORONARY ANGIOGRAPHY;  Surgeon: Wellington Hampshire, MD;  Location: [place] CV LAB;  Service: Cardiovascular;  Laterality: N/A;    Family History  Problem Relation Age of Onset   Diabetes Other    Hypertension Other    Heart disease Other     Social History   Socioeconomic History   Marital status: Single    Spouse name: Not on file   Number of children: Not on file   Years of education: Not on file   Highest education level: Not on file  Occupational History   Not on file  Tobacco Use   Smoking status: Former    Packs/day: 1.50    Types: Cigarettes    Quit date: 07/04/2020    Years since quitting: 1.9   Smokeless tobacco: Never   Vaping Use   Vaping Use: Never used  Substance and Sexual Activity   Alcohol use: Never   Drug use: Never   Sexual activity: Not on file  Other Topics Concern   Not on file  Social History Narrative   Not on file   Social Determinants of Health   Financial Resource Strain: Not on file  Food Insecurity: Not on file  Transportation Needs: Not on file  Physical Activity: Not on file  Stress: Not on file  Social Connections: Not on file  Intimate Partner Violence: Not on file    Outpatient Medications Prior to Visit  Medication Sig Dispense Refill   acetaminophen (TYLENOL) 325 MG tablet Take 2 tablets (650 mg total) by mouth every 4 (four) hours as needed for headache or mild pain.     aspirin 81 MG chewable tablet Chew 1 tablet (81 mg total) by mouth daily.     atorvastatin (LIPITOR) 40 MG tablet TAKE 1 TABLET BY MOUTH EVERY DAY 90 tablet 3   COSENTYX SENSOREADY, 300 MG, 150 MG/ML SOAJ Inject 2 Syringes into the skin every 28 (twenty-eight) days.     fluticasone (FLONASE) 50 MCG/ACT nasal spray Place 1-2 sprays into both nostrils daily as needed for allergies or rhinitis.     Menthol, Topical Analgesic, (MINERAL ICE EX) Apply 1 application topically  daily as needed (foot pain).     metoprolol succinate (TOPROL-XL) 25 MG 24 hr tablet TAKE 1/2 TABLET BY MOUTH EVERY DAY 45 tablet 3   nitroGLYCERIN (NITROSTAT) 0.4 MG SL tablet Place 1 tablet (0.4 mg total) under the tongue every 5 (five) minutes as needed for chest pain. 25 tablet 4   omeprazole (PRILOSEC) 40 MG capsule TAKE 1 CAPSULE BY MOUTH TWICE DAILY BEFORE A MEAL 180 capsule 1   Facility-Administered Medications Prior to Visit  Medication Dose Route Frequency Provider Last Rate Last Admin   sodium chloride flush (NS) 0.9 % injection 3 mL  3 mL Intravenous Q12H Munley, Hilton Cork, MD        Allergies  Allergen Reactions   Ranitidine Swelling    Review of Systems CONSTITUTIONAL: see HPI E/N/T: see HPI CARDIOVASCULAR:  Negative for chest pain, dizziness, palpitations and pedal edema.  RESPIRATORY: see HPI GASTROINTESTINAL: Negative for abdominal pain, acid reflux symptoms, constipation, diarrhea, nausea and vomiting.          Objective:  PHYSICAL EXAM:   VS: BP (!) 138/90   Pulse 70   Temp (!) 97.4 F (36.3 C)   Ht _0  (1.727 m)   Wt 169 lb 3.2 oz (76.7 kg)   SpO2 96%   BMI 25.73 kg/m   GEN: Well nourished, well developed, in no acute distress  HEENT: normal external ears and nose - normal external auditory canals and TMS -- Lips, Teeth and Gums - normal  Oropharynx - erythema/pnd Cardiac: RRR; no murmurs, rubs,  Respiratory:  faint rhonchi - clears with cough GI: normal bowel sounds, no masses or tenderness Skin: warm and dry, no rash   Office Visit on 06/26/2022  Component Date Value Ref Range Status   SARS Coronavirus 2 Ag 06/26/2022 Negative  Negative Final      Health Maintenance Due  Topic Date Due   HIV Screening  Never done   DTaP/Tdap/Td (1 - Tdap) Never done   COLONOSCOPY (Pts 45-63yr Insurance coverage will need to be confirmed)  Never done   Zoster Vaccines- Shingrix (1 of 2) Never done   INFLUENZA VACCINE  Never done    There are no preventive care reminders to display for this patient.   No results found for: "TSH" Lab Results  Component Value Date   WBC 6.4 02/12/2022   HGB 14.0 02/12/2022   HCT 41.1 02/12/2022   MCV 91 02/12/2022   PLT 241 02/12/2022   Lab Results  Component Value Date   NA 143 04/08/2022   K 4.1 04/08/2022   CO2 24 04/08/2022   GLUCOSE 89 04/08/2022   BUN 10 04/08/2022   CREATININE 0.92 04/08/2022   BILITOT 0.9 04/08/2022   ALKPHOS 61 04/08/2022   AST 16 04/08/2022   ALT 24 04/08/2022   PROT 7.2 04/08/2022   ALBUMIN 5.0 (H) 04/08/2022   CALCIUM 9.8 04/08/2022   ANIONGAP 9 07/05/2020   EGFR 99 04/08/2022   Lab Results  Component Value Date   CHOL 111 04/08/2022   Lab Results  Component Value Date   HDL 40 04/08/2022    Lab Results  Component Value Date   LDLCALC 52 04/08/2022   Lab Results  Component Value Date   TRIG 101 04/08/2022   Lab Results  Component Value Date   CHOLHDL 2.8 04/08/2022   Lab Results  Component Value Date   HGBA1C 5.8 (H) 07/04/2020       Assessment & Plan:   Problem List  Items Addressed This Visit   None Visit Diagnoses     Acute upper respiratory infection    -  Primary   Relevant Orders   POC COVID-19 BinaxNow (Completed)      No orders of the defined types were placed in this encounter.   Orders Placed This Encounter  Procedures   POC COVID-19 BinaxNow     Follow-up: Return if symptoms worsen or fail to improve.  An After Visit Summary was printed and given to the patient.  Yetta Flock [facility] [phone]

## 2022-09-15 ENCOUNTER — Other Ambulatory Visit: Payer: Self-pay

## 2022-09-15 MED ORDER — OMEPRAZOLE 40 MG PO CPDR: DELAYED_RELEASE_CAPSULE | ORAL | 1 refills | Status: AC

## 2022-10-17 ENCOUNTER — Encounter: Payer: Self-pay | Admitting: Cardiology

## 2022-10-18 ENCOUNTER — Other Ambulatory Visit: Payer: Self-pay

## 2022-10-22 MED ORDER — AMLODIPINE BESYLATE 5 MG PO TABS: 5.0000 mg | ORAL_TABLET | Freq: Every day | ORAL | 3 refills | Status: DC

## 2022-12-20 ENCOUNTER — Other Ambulatory Visit: Payer: Self-pay | Admitting: *Deleted

## 2022-12-20 DIAGNOSIS — I739 Peripheral vascular disease, unspecified: Secondary | ICD-10-CM

## 2023-01-02 ENCOUNTER — Encounter: Payer: Self-pay | Admitting: Vascular Surgery

## 2023-01-02 ENCOUNTER — Ambulatory Visit: Payer: Managed Care, Other (non HMO) | Admitting: Vascular Surgery

## 2023-01-02 ENCOUNTER — Ambulatory Visit (HOSPITAL_COMMUNITY)
Admission: RE | Admit: 2023-01-02 | Discharge: 2023-01-02 | Disposition: A | Discharge status: Home / Self Care | Payer: Managed Care, Other (non HMO) | Source: Ambulatory Visit | Attending: Vascular Surgery | Admitting: Vascular Surgery

## 2023-01-02 VITALS — BP 125/83 | HR 72 | Temp 98.2°F | Resp 20 | Ht 68.0 in | Wt 161.8 lb

## 2023-01-02 DIAGNOSIS — I739 Peripheral vascular disease, unspecified: Secondary | ICD-10-CM | POA: Insufficient documentation

## 2023-01-02 DIAGNOSIS — I70219 Atherosclerosis of native arteries of extremities with intermittent claudication, unspecified extremity: Secondary | ICD-10-CM | POA: Diagnosis not present

## 2023-01-02 LAB — VAS US ABI WITH/WO TBI
Left ABI: 0.64
Right ABI: 1.19

## 2023-01-02 NOTE — Progress Notes (Signed)
REASON FOR VISIT:   Follow-up of peripheral arterial disease.  MEDICAL ISSUES:   PERIPHERAL ARTERIAL DISEASE WITH CLAUDICATION: This patient has stable left calf claudication.  He can actually walk farther now that he could when I saw him 9 months ago.  His ABI has improved slightly.  He quit smoking in 2021.  I encouraged him to continue to walk is much as possible.  I explained that we would typically consider arteriography if he developed disabling claudication, rest pain, or nonhealing ulcer.  He would like to be seen back in 9 months and have ordered follow-up ABIs at that time.  I have explained that I will be retiring.  He would like to be seen by one of the physicians.  He will call sooner if his symptoms progress or he decides to proceed with arteriography.   HPI:   Matthew Patel is a pleasant 57 y.o. male who I saw on 03/28/2022 with stable claudication of the left calf.  He had evidence of infrainguinal arterial occlusive disease on the left.  We discussed the importance of a structured walking program and I encouraged him to stay off the cigarettes.  He quit in 2021.  We also discussed the importance of nutrition.  He comes back for a 80-month follow-up visit.  At the time of that visit, his ABI was 100% on the right and 42% on the left.  Toe pressure on the left was 53 mmHg.  Since I saw him last he continues to have left calf claudication.  He can walk further than last time although he has a hard time chronic finding the exact distance.  He denies any rest pain or nonhealing ulcers.  He quit smoking in 2021.  He is on aspirin and is on a statin.  Past Medical History:  Diagnosis Date   CAD in native artery 07/05/2020   Cardiomyopathy, ischemic 07/05/2020   GERD (gastroesophageal reflux disease)    Migraines    Mixed hyperlipidemia    S/P angioplasty with stent RCA DES 07/04/20 07/05/2020   Tobacco abuse 07/05/2020    Family History  Problem Relation Age of Onset    Diabetes Other    Hypertension Other    Heart disease Other     SOCIAL HISTORY: Social History   Tobacco Use   Smoking status: Former    Packs/day: 1.5    Types: Cigarettes    Quit date: 07/04/2020    Years since quitting: 2.4   Smokeless tobacco: Never  Substance Use Topics   Alcohol use: Never    Allergies  Allergen Reactions   Ranitidine Swelling    Current Outpatient Medications  Medication Sig Dispense Refill   acetaminophen (TYLENOL) 325 MG tablet Take 2 tablets (650 mg total) by mouth every 4 (four) hours as needed for headache or mild pain.     amLODipine (NORVASC) 5 MG tablet Take 1 tablet (5 mg total) by mouth daily. 90 tablet 3   amoxicillin-clavulanate (AUGMENTIN) 875-125 MG tablet Take 1 tablet by mouth 2 (two) times daily. 20 tablet 0   aspirin 81 MG chewable tablet Chew 1 tablet (81 mg total) by mouth daily.     atorvastatin (LIPITOR) 40 MG tablet TAKE 1 TABLET BY MOUTH EVERY DAY 90 tablet 3   COSENTYX SENSOREADY, 300 MG, 150 MG/ML SOAJ Inject 2 Syringes into the skin every 28 (twenty-eight) days.     fluticasone (FLONASE) 50 MCG/ACT nasal spray Place 1-2 sprays into both nostrils daily as  needed for allergies or rhinitis.     HYDROcodone bit-homatropine (HYDROMET) 5-1.5 MG/5ML syrup Take 5 mLs by mouth every 6 (six) hours as needed. 120 mL 0   Menthol, Topical Analgesic, (MINERAL ICE EX) Apply 1 application topically daily as needed (foot pain).     metoprolol succinate (TOPROL-XL) 25 MG 24 hr tablet TAKE 1/2 TABLET BY MOUTH EVERY DAY 45 tablet 3   nitroGLYCERIN (NITROSTAT) 0.4 MG SL tablet Place 1 tablet (0.4 mg total) under the tongue every 5 (five) minutes as needed for chest pain. 25 tablet 4   omeprazole (PRILOSEC) 40 MG capsule TAKE 1 CAPSULE BY MOUTH TWICE DAILY BEFORE A MEAL 180 capsule 1   Current Facility-Administered Medications  Medication Dose Route Frequency Provider Last Rate Last Admin   sodium chloride flush (NS) 0.9 % injection 3 mL  3 mL  Intravenous Q12H Munley, Iline Oven, MD        REVIEW OF SYSTEMS:  [X]  denotes positive finding, [ ]  denotes negative finding Cardiac  Comments:  Chest pain or chest pressure:    Shortness of breath upon exertion:    Short of breath when lying flat:    Irregular heart rhythm:        Vascular    Pain in calf, thigh, or hip brought on by ambulation: x   Pain in feet at night that wakes you up from your sleep:     Blood clot in your veins:    Leg swelling:         Pulmonary    Oxygen at home:    Productive cough:     Wheezing:         Neurologic    Sudden weakness in arms or legs:     Sudden numbness in arms or legs:     Sudden onset of difficulty speaking or slurred speech:    Temporary loss of vision in one eye:     Problems with dizziness:         Gastrointestinal    Blood in stool:     Vomited blood:         Genitourinary    Burning when urinating:     Blood in urine:        Psychiatric    Major depression:         Hematologic    Bleeding problems:    Problems with blood clotting too easily:        Skin    Rashes or ulcers:        Constitutional    Fever or chills:     PHYSICAL EXAM:   Vitals:   01/02/23 1528  BP: 125/83  Pulse: 72  Resp: 20  Temp: 98.2 F (36.8 C)  SpO2: 96%  Weight: 161 lb 12.8 oz (73.4 kg)  Height: 5\' 8"  (1.727 m)    GENERAL: The patient is a well-nourished male, in no acute distress. The vital signs are documented above. CARDIAC: There is a regular rate and rhythm.  VASCULAR: I do not detect carotid bruits. On the right side he has a palpable femoral, popliteal, dorsalis pedis, and posterior tibial pulse. On the left side, which is the site of concern, he has a palpable femoral pulse.  I cannot palpate a popliteal or pedal pulses. He has no significant lower extremity swelling. PULMONARY: There is good air exchange bilaterally without wheezing or rales. ABDOMEN: Soft and non-tender with normal pitched bowel sounds.  I do not  palpate any  aneurysm. MUSCULOSKELETAL: There are no major deformities or cyanosis. NEUROLOGIC: No focal weakness or paresthesias are detected. SKIN: There are no ulcers or rashes noted. PSYCHIATRIC: The patient has a normal affect.  DATA:    ARTERIAL DOPPLER STUDY: I have independently interpreted his arterial Doppler study today.  On the right side there is a triphasic posterior tibial and dorsalis pedis signal.  ABIs 100%.  Toe pressures 108 mmHg.  On the left side there is a monophasic posterior tibial and dorsalis pedis signal.  ABIs 64%.  Toe pressure is 53 mmHg.  Waverly Ferrari [facility] [phone]

## 2023-01-06 ENCOUNTER — Other Ambulatory Visit: Payer: Self-pay | Admitting: *Deleted

## 2023-01-06 DIAGNOSIS — I739 Peripheral vascular disease, unspecified: Secondary | ICD-10-CM

## 2023-01-06 DIAGNOSIS — I70219 Atherosclerosis of native arteries of extremities with intermittent claudication, unspecified extremity: Secondary | ICD-10-CM

## 2023-02-26 NOTE — Progress Notes (Unsigned)
Cardiology Office Note:    Date:  02/27/2023   ID:  Matthew Patel, DOB 1966-05-12, MRN [id number]  PCP:  Patient, No Pcp Per  Cardiologist:  Norman Herrlich, MD    Referring MD: No ref. provider found    ASSESSMENT:    1. CAD in native artery   2. Cardiomyopathy, ischemic   3. Mixed hyperlipidemia   4. Intermittent claudication (HCC)    PLAN:    In order of problems listed above:  Doing well with CAD following PCI and on current medical therapy no anginal discomfort continue aspirin high intensity statin and beta-blocker calcium channel blocker hypertension Improved EF normalized with guideline directed therapy and revascularization Continue his statin check CMP and lipid profile today More could be improved with smoking cessation walking program and continues to follow with vascular surgery   Next appointment: 6 months   Medication Adjustments/Labs and Tests Ordered: Current medicines are reviewed at length with the patient today.  Concerns regarding medicines are outlined above.  Orders Placed This Encounter  Procedures   Comp Met (CMET)   Lipid Profile   Meds ordered this encounter  Medications   nitroGLYCERIN (NITROSTAT) 0.4 MG SL tablet    Sig: Place 1 tablet (0.4 mg total) under the tongue every 5 (five) minutes as needed for chest pain.    Dispense:  25 tablet    Refill:  11     History of Present Illness:    Matthew Patel is a 57 y.o. male with a hx of CAD with ST elevation MI PCI and drug-eluting stent right coronary artery February 2021 with initial course complicated by reperfusion mediated hypotension idioventricular rhythm and ventricular arrhythmia requiring pressors Levophed and IV amiodarone and subsequent symptomatic hypotension with severe LV dysfunction normalizing and repeat coronary angiography showed widely patent stent to right coronary artery without restenosis and otherwise mild nonobstructive CAD last seen 04/08/2022.  Compliance with diet,  lifestyle and medications: Yes  His wife is present participates in the evaluation and decision making Home blood pressure runs in the range of 110/70-80 He has intermittent palpitation not severe or sustained or bothersome He has had no anginal discomfort edema shortness of breath or syncope With cigarette smoking cessation a regular walking program marked improvement in his lower extremity claudication He tolerates his lipid-lowering therapy without muscle pain or weakness Past Medical History:  Diagnosis Date   Atherosclerotic heart disease of native coronary artery with other forms of angina pectoris (HCC)    CAD in native artery 07/05/2020   Cardiomyopathy, ischemic 07/05/2020   COPD (chronic obstructive pulmonary disease) (HCC) 11/12/2019   GERD (gastroesophageal reflux disease)    Migraines    Mixed hyperlipidemia    PAD (peripheral artery disease) (HCC) 02/12/2022   Plantar fasciitis of left foot 02/08/2020   Psoriasis 02/12/2022   S/P angioplasty with stent RCA DES 07/04/20 07/05/2020   Tarsal tunnel syndrome of left side 02/08/2020   Tendonitis, Achilles, left 02/08/2020   Tension type headache 04/04/2015    Current Medications: Current Meds  Medication Sig   acetaminophen (TYLENOL) 325 MG tablet Take 2 tablets (650 mg total) by mouth every 4 (four) hours as needed for headache or mild pain.   amLODipine (NORVASC) 2.5 MG tablet Take 2.5 mg by mouth as needed (when blood pressure ig high).   aspirin 81 MG chewable tablet Chew 1 tablet (81 mg total) by mouth daily.   atorvastatin (LIPITOR) 40 MG tablet TAKE 1 TABLET BY MOUTH EVERY DAY  COSENTYX SENSOREADY, 300 MG, 150 MG/ML SOAJ Inject 2 Syringes into the skin every 28 (twenty-eight) days.   fluticasone (FLONASE) 50 MCG/ACT nasal spray Place 1-2 sprays into both nostrils daily as needed for allergies or rhinitis.   Menthol, Topical Analgesic, (MINERAL ICE EX) Apply 1 application topically daily as needed (foot pain).    metoprolol succinate (TOPROL-XL) 25 MG 24 hr tablet TAKE 1/2 TABLET BY MOUTH EVERY DAY   omeprazole (PRILOSEC) 40 MG capsule TAKE 1 CAPSULE BY MOUTH TWICE DAILY BEFORE A MEAL   [DISCONTINUED] nitroGLYCERIN (NITROSTAT) 0.4 MG SL tablet Place 1 tablet (0.4 mg total) under the tongue every 5 (five) minutes as needed for chest pain.   Current Facility-Administered Medications for the 02/27/23 encounter (Office Visit) with Baldo Daub, MD  Medication   sodium chloride flush (NS) 0.9 % injection 3 mL      EKGs/Labs/Other Studies Reviewed:    The following studies were reviewed today:  Cardiac Studies & Procedures   CARDIAC CATHETERIZATION  CARDIAC CATHETERIZATION 02/02/2021  Narrative  Ost LAD to Prox LAD lesion is 10% stenosed.  Prox LAD to Mid LAD lesion is 20% stenosed.  Non-stenotic Prox RCA to Mid RCA lesion was previously treated.  The left ventricular systolic function is normal.  LV end diastolic pressure is normal.  The left ventricular ejection fraction is 55-65% by visual estimate.  Prox RCA lesion is 30% stenosed.  1.  Widely patent RCA stent with no significant restenosis.  Mild stenosis proximal to the RCA stent thought to be due to catheter induced spasm.  No obstructive disease affecting the left system. 2.  Normal LV systolic function and normal left ventricular end-diastolic pressure. 3.  Moderate difficulty of catheterization via the right radial artery due to somewhat left-sided origin of the innominate artery with tortuosity.  Recommendations: Continue medical therapy.  Findings Coronary Findings Diagnostic  Dominance: Right  Left Anterior Descending Ost LAD to Prox LAD lesion is 10% stenosed. Prox LAD to Mid LAD lesion is 20% stenosed.  Right Coronary Artery Prox RCA lesion is 30% stenosed. Suspected catheter induced spasm Non-stenotic Prox RCA to Mid RCA lesion was previously treated.  Right Ventricular Branch Vessel is small in size.  Right  Posterior Descending Artery  Intervention  No interventions have been documented.   CARDIAC CATHETERIZATION  CARDIAC CATHETERIZATION 07/04/2020  Narrative  Prox RCA to Mid RCA lesion is 100% stenosed.  RPDA lesion is 100% stenosed.  Ost LAD to Prox LAD lesion is 20% stenosed.  Prox LAD to Mid LAD lesion is 20% stenosed.  Post intervention, there is a 0% residual stenosis.  A stent was successfully placed.  Acute inferior ST segment elevation myocardial infarction secondary to thrombotic occlusion of the proximal to mid RCA with initial TIMI 0 flow.  Mild 20% ostial narrowing of the LAD and 20% mid LAD stenosis.  Normal left circumflex coronary artery.  Reperfusion mediated hypotension, idioventricular rhythm, requiring fluid resuscitation, IV amiodarone in addition to Levophed.  Aggrastat administration with thrombotic occlusion and probable embolized thrombus to the very distal PDA.  Successful percutaneous coronary intervention to the RCA with ultimate insertion of a 3.5 x 30 mm Resolute Onyx DES stent postdilated to 3.55 mm with the 100 % occlusion being reduced to 0% and restoration of brisk TIMI-3 flow.  RECOMMENDATION: DAPT for minimum of 1 year.  2D echo Doppler study will be done in a.m. to assess RV and LV function.  Continued fluid resuscitation with weaning and discontinuance of Levophed  as blood pressure allows.  Aggressive lipid-lowering therapy with target LDL less than 70.  Once Levophed is considered, initiate post MI beta-blocker and ACE/ARB.  Smoking cessation is essential.  Findings Coronary Findings Diagnostic  Dominance: Right  Left Anterior Descending Ost LAD to Prox LAD lesion is 20% stenosed. Prox LAD to Mid LAD lesion is 20% stenosed.  Right Coronary Artery Prox RCA to Mid RCA lesion is 100% stenosed.  Right Ventricular Branch Vessel is small in size.  Right Posterior Descending Artery RPDA lesion is 100% stenosed.  Intervention  Prox  RCA to Mid RCA lesion Stent A stent was successfully placed. Post-Intervention Lesion Assessment The intervention was successful. Pre-interventional TIMI flow is 0. Post-intervention TIMI flow is 3. No complications occurred at this lesion. There is a 0% residual stenosis post intervention.     ECHOCARDIOGRAM  ECHOCARDIOGRAM COMPLETE 09/27/2020  Narrative ECHOCARDIOGRAM REPORT    Patient Name:   GENOVEVO GURUNG Date of Exam: 09/27/2020 Medical Rec #:  [id number]       Height:       68.0 in Accession #:    [id number]      Weight:       153.0 lb Date of Birth:  1966-01-13       BSA:          1.824 m Patient Age:    57 years        BP:           128/84 mmHg Patient Gender: M               HR:           61 bpm. Exam Location:  [place]  Procedure: 2D Echo, 3D Echo, Cardiac Doppler and Color Doppler  Indications:    I25.5 Ischemic cardiomyopathy  History:        Patient has prior history of Echocardiogram examinations, most recent 07/04/2020. Cardiomyopathy, CAD and Previous Myocardial Infarction; Risk Factors:Dyslipidemia.  Sonographer:    Joaquim Nam Referring Phys: [id number] Delroy Ordway J Dominique Ressel  IMPRESSIONS   1. Left ventricular ejection fraction, by estimation, is 55 to 60%. The left ventricle has normal function. The left ventricle demonstrates regional wall motion abnormalities (see scoring diagram/findings for description). Left ventricular diastolic parameters were normal. 2. Right ventricular systolic function is normal. The right ventricular size is normal. 3. The mitral valve is normal in structure. No evidence of mitral valve regurgitation. No evidence of mitral stenosis. 4. The aortic valve is tricuspid. Aortic valve regurgitation is not visualized. No aortic stenosis is present. 5. The inferior vena cava is normal in size with greater than 50% respiratory variability, suggesting right atrial pressure of 3 mmHg.  FINDINGS Left Ventricle: Left ventricular ejection fraction,  by estimation, is 55 to 60%. The left ventricle has normal function. The left ventricle demonstrates regional wall motion abnormalities. The left ventricular internal cavity size was normal in size. There is no left ventricular hypertrophy. Left ventricular diastolic parameters were normal. Normal left ventricular filling pressure.   LV Wall Scoring: The basal inferior segment is hypokinetic. The entire anterior wall, entire lateral wall, entire septum, entire apex, and mid and distal inferior wall are normal.  Right Ventricle: The right ventricular size is normal. No increase in right ventricular wall thickness. Right ventricular systolic function is normal.  Left Atrium: Left atrial size was normal in size.  Right Atrium: Right atrial size was normal in size.  Pericardium: There is no evidence of pericardial effusion.  Mitral  Valve: The mitral valve is normal in structure. No evidence of mitral valve regurgitation. No evidence of mitral valve stenosis.  Tricuspid Valve: The tricuspid valve is normal in structure. Tricuspid valve regurgitation is not demonstrated. No evidence of tricuspid stenosis.  Aortic Valve: The aortic valve is tricuspid. Aortic valve regurgitation is not visualized. No aortic stenosis is present.  Pulmonic Valve: The pulmonic valve was normal in structure. Pulmonic valve regurgitation is not visualized. No evidence of pulmonic stenosis.  Aorta: The aortic root, ascending aorta, aortic arch and descending aorta are all structurally normal, with no evidence of dilitation or obstruction.  Venous: A normal flow pattern is recorded from the right upper pulmonary vein. The inferior vena cava is normal in size with greater than 50% respiratory variability, suggesting right atrial pressure of 3 mmHg.  IAS/Shunts: No atrial level shunt detected by color flow Doppler.   LEFT VENTRICLE PLAX 2D LVIDd:         4.68 cm  Diastology LVIDs:         3.72 cm  LV e' medial:     7.62 cm/s LV PW:         1.01 cm  LV E/e' medial:  8.0 LV IVS:        0.88 cm  LV e' lateral:   11.20 cm/s LVOT diam:     2.00 cm  LV E/e' lateral: 5.5 LV SV:         43 LV SV Index:   23 LVOT Area:     3.14 cm  3D Volume EF: 3D EF:        46 % LV EDV:       152 ml LV ESV:       82 ml LV SV:        70 ml  RIGHT VENTRICLE RV S prime:     9.46 cm/s TAPSE (M-mode): 1.9 cm  LEFT ATRIUM             Index       RIGHT ATRIUM          Index LA diam:        3.40 cm 1.86 cm/m  RA Area:     9.79 cm LA Vol (A2C):   28.0 ml 15.35 ml/m RA Volume:   17.60 ml 9.65 ml/m LA Vol (A4C):   20.5 ml 11.24 ml/m LA Biplane Vol: 24.3 ml 13.32 ml/m AORTIC VALVE LVOT Vmax:   71.10 cm/s LVOT Vmean:  50.300 cm/s LVOT VTI:    0.136 m  AORTA Ao Root diam: 3.20 cm Ao Asc diam:  3.00 cm  MITRAL VALVE MV Area (PHT): 5.02 cm    SHUNTS MV Decel Time: 151 msec    Systemic VTI:  0.14 m MV E velocity: 61.30 cm/s  Systemic Diam: 2.00 cm MV A velocity: 56.10 cm/s MV E/A ratio:  1.09  Norman Herrlich MD Electronically signed by Norman Herrlich MD Signature Date/Time: 09/27/2020/12:47:32 PM    Final                 Recent Labs: 04/08/2022: ALT 24; BUN 10; Creatinine, Ser 0.92; Potassium 4.1; Sodium 143  Recent Lipid Panel    Component Value Date/Time   CHOL 111 04/08/2022 1410   TRIG 101 04/08/2022 1410   HDL 40 04/08/2022 1410   CHOLHDL 2.8 04/08/2022 1410   CHOLHDL 5.7 07/04/2020 0200   VLDL 35 07/04/2020 0200   LDLCALC 52 04/08/2022 1410    Physical Exam:  VS:  BP (!) 146/82   Pulse 66   Ht 5\' 7"  (1.702 m)   Wt 162 lb (73.5 kg)   SpO2 94%   BMI 25.37 kg/m     Wt Readings from Last 3 Encounters:  02/27/23 162 lb (73.5 kg)  01/02/23 161 lb 12.8 oz (73.4 kg)  06/26/22 169 lb 3.2 oz (76.7 kg)     GEN:  Well nourished, well developed in no acute distress HEENT: Normal NECK: No JVD; No carotid bruits LYMPHATICS: No lymphadenopathy CARDIAC: RRR, no murmurs, rubs,  gallops RESPIRATORY:  Clear to auscultation without rales, wheezing or rhonchi  ABDOMEN: Soft, non-tender, non-distended MUSCULOSKELETAL:  No edema; No deformity  SKIN: Warm and dry NEUROLOGIC:  Alert and oriented x 3 PSYCHIATRIC:  Normal affect    Signed, Norman Herrlich, MD  02/27/2023 4:59 PM    [facility]

## 2023-02-27 ENCOUNTER — Encounter: Payer: Self-pay | Admitting: Cardiology

## 2023-02-27 ENCOUNTER — Ambulatory Visit: Payer: Managed Care, Other (non HMO) | Attending: Cardiology | Admitting: Cardiology

## 2023-02-27 VITALS — BP 146/82 | HR 66 | Ht 67.0 in | Wt 162.0 lb

## 2023-02-27 DIAGNOSIS — E782 Mixed hyperlipidemia: Secondary | ICD-10-CM | POA: Diagnosis not present

## 2023-02-27 DIAGNOSIS — I255 Ischemic cardiomyopathy: Secondary | ICD-10-CM | POA: Diagnosis not present

## 2023-02-27 DIAGNOSIS — I739 Peripheral vascular disease, unspecified: Secondary | ICD-10-CM | POA: Diagnosis not present

## 2023-02-27 DIAGNOSIS — I2510 Atherosclerotic heart disease of native coronary artery without angina pectoris: Secondary | ICD-10-CM | POA: Diagnosis not present

## 2023-02-27 MED ORDER — NITROGLYCERIN 0.4 MG SL SUBL: 0.4000 mg | SUBLINGUAL_TABLET | SUBLINGUAL | 11 refills | Status: AC | PRN

## 2023-02-27 NOTE — Patient Instructions (Addendum)
Medication Instructions:  Your physician recommends that you continue on your current medications as directed. Please refer to the Current Medication list given to you today.  *If you need a refill on your cardiac medications before your next appointment, please call your pharmacy*   Lab Work: Your physician recommends that you return for lab work in:   Labs today: CMP, Lipids  If you have labs (blood work) drawn today and your tests are completely normal, you will receive your results only by: MyChart Message (if you have MyChart) OR A paper copy in the mail If you have any lab test that is abnormal or we need to change your treatment, we will call you to review the results.   Testing/Procedures: None   Follow-Up: At [facility], you and your health needs are our priority.  As part of our continuing mission to provide you with exceptional heart care, we have created designated Provider Care Teams.  These Care Teams include your primary Cardiologist (physician) and Advanced Practice Providers (APPs -  Physician Assistants and Nurse Practitioners) who all work together to provide you with the care you need, when you need it.  We recommend signing up for the patient portal called "MyChart".  Sign up information is provided on this After Visit Summary.  MyChart is used to connect with patients for Virtual Visits (Telemedicine).  Patients are able to view lab/test results, encounter notes, upcoming appointments, etc.  Non-urgent messages can be sent to your provider as well.   To learn more about what you can do with MyChart, go to [email or web address].    Your next appointment:   6 month(s)  Provider:   Brian Munley, MD    Other Instructions None  

## 2023-05-07 ENCOUNTER — Other Ambulatory Visit: Payer: Self-pay | Admitting: Cardiology

## 2023-05-15 ENCOUNTER — Encounter: Payer: Self-pay | Admitting: Cardiology

## 2023-05-26 ENCOUNTER — Encounter: Payer: Self-pay | Admitting: Cardiology

## 2023-06-05 ENCOUNTER — Other Ambulatory Visit: Payer: Self-pay

## 2023-06-13 MED ORDER — METOPROLOL SUCCINATE ER 25 MG PO TB24: 12.5000 mg | ORAL_TABLET | Freq: Every day | ORAL | 2 refills | Status: DC

## 2023-06-13 MED ORDER — ATORVASTATIN CALCIUM 40 MG PO TABS: 40.0000 mg | ORAL_TABLET | Freq: Every day | ORAL | 2 refills | Status: AC

## 2023-08-24 NOTE — H&P (View-Only) (Signed)
Cardiology Office Note:  .   Date:  08/25/2023  ID:  Matthew Patel, DOB 1966/07/24, MRN [id number] PCP: [facility] Prov[facility] Cardiologist:  Norman Herrlich, MD     He was also seen by me during the office visit. He is well-known to me with a history of very complex heart disease overall is done well but recently is having increasing frequency of typical exertional angina but also episodes at rest and he has had nocturnal but awaken him from his sleep  In view of his clinical pattern known CAD I think he is best served by direct referral to coronary angiography over functional testing.  Risk options and benefits detailed informed consent obtained. History of Present Illness: .   Matthew Patel is a 58 y.o. male with a past medical history of HFimpEF, CAD, migraines, PAD--followed by [facility], COPD, GERD, dyslipidemia, prior tobacco use.  02/02/2021 left heart cath widely patent DES to the RCA 09/27/2020 echo EF 55 to 60%, positive RWMA 07/04/2020 echo EF 40 to 45%, positive RWMA, mild concentric LVH, mild hypokinesis of the LV with moderate hypokinesis of the LV basal-mid inferolateral wall 07/04/2020 left heart cath STEMI DES to the proximal to mid RCA  In 2021 he underwent a STEMI with subsequent DES to the RCA.  At that time his EF was diminished.  Subsequent left heart cath in 2022 revealed widely patent RCA stent.  Most recently evaluated by Dr. Dulce Sellar on 02/27/2023, he was stable from a cardiac perspective and advised he can follow-up in 6 months.  He presents today with concerns of chest pain.  This started approximately a month ago, mixed features--can occur with rest or exertion, most episodes he would describe as pressure radiating to his left arm and up the back of his neck.  He has not taken his nitroglycerin--he states he did not think that his episode had been severe enough to take them.  When his episodes of chest pain occur, he also endorses dizziness.  He states  he can hardly even wash dishes currently without feeling short of breath, which is a drastic change for him.  He has some nonspecific EKG changes today. He denies, pnd, orthopnea, n, v,  syncope, edema, weight gain, or early satiety.   ROS: Review of Systems  Constitutional:  Positive for malaise/fatigue.  Respiratory:  Positive for shortness of breath.   Cardiovascular:  Positive for chest pain and palpitations.  Neurological:  Positive for dizziness.  All other systems reviewed and are negative.    Studies Reviewed: Marland Kitchen   EKG Interpretation Date/Time:  Monday August 25 2023 10:48:59 EST Ventricular Rate:  70 PR Interval:  170 QRS Duration:  98 QT Interval:  394 QTC Calculation: 425 R Axis:   32  Text Interpretation: Normal sinus rhythm Inferior infarct (cited on or before 04-Jul-2020) Possible Anterior infarct , age undetermined Abnormal ECG When compared with ECG of 04-Jul-2020 04:22, Borderline criteria for Anterior infarct are now Present ST no longer depressed in Anterior leads T wave inversion less evident in Inferior leads Confirmed by Wallis Bamberg [phone]) on 08/25/2023 10:57:10 AM    Cardiac Studies & Procedures   CARDIAC CATHETERIZATION  CARDIAC CATHETERIZATION 02/02/2021  Narrative  Ost LAD to Prox LAD lesion is 10% stenosed.  Prox LAD to Mid LAD lesion is 20% stenosed.  Non-stenotic Prox RCA to Mid RCA lesion was previously treated.  The left ventricular systolic function is normal.  LV end diastolic pressure is normal.  The left ventricular ejection fraction is 55-65% by visual estimate.  Prox RCA lesion is 30% stenosed.  1.  Widely patent RCA stent with no significant restenosis.  Mild stenosis proximal to the RCA stent thought to be due to catheter induced spasm.  No obstructive disease affecting the left system. 2.  Normal LV systolic function and normal left ventricular end-diastolic pressure. 3.  Moderate difficulty of catheterization via the right radial  artery due to somewhat left-sided origin of the innominate artery with tortuosity.  Recommendations: Continue medical therapy.  Findings Coronary Findings Diagnostic  Dominance: Right  Left Anterior Descending Ost LAD to Prox LAD lesion is 10% stenosed. Prox LAD to Mid LAD lesion is 20% stenosed.  Right Coronary Artery Prox RCA lesion is 30% stenosed. Suspected catheter induced spasm Non-stenotic Prox RCA to Mid RCA lesion was previously treated.  Right Ventricular Branch Vessel is small in size.  Right Posterior Descending Artery  Intervention  No interventions have been documented.   CARDIAC CATHETERIZATION  CARDIAC CATHETERIZATION 07/04/2020  Narrative  Prox RCA to Mid RCA lesion is 100% stenosed.  RPDA lesion is 100% stenosed.  Ost LAD to Prox LAD lesion is 20% stenosed.  Prox LAD to Mid LAD lesion is 20% stenosed.  Post intervention, there is a 0% residual stenosis.  A stent was successfully placed.  Acute inferior ST segment elevation myocardial infarction secondary to thrombotic occlusion of the proximal to mid RCA with initial TIMI 0 flow.  Mild 20% ostial narrowing of the LAD and 20% mid LAD stenosis.  Normal left circumflex coronary artery.  Reperfusion mediated hypotension, idioventricular rhythm, requiring fluid resuscitation, IV amiodarone in addition to Levophed.  Aggrastat administration with thrombotic occlusion and probable embolized thrombus to the very distal PDA.  Successful percutaneous coronary intervention to the RCA with ultimate insertion of a 3.5 x 30 mm Resolute Onyx DES stent postdilated to 3.55 mm with the 100 % occlusion being reduced to 0% and restoration of brisk TIMI-3 flow.  RECOMMENDATION: DAPT for minimum of 1 year.  2D echo Doppler study will be done in a.m. to assess RV and LV function.  Continued fluid resuscitation with weaning and discontinuance of Levophed as blood pressure allows.  Aggressive lipid-lowering therapy  with target LDL less than 70.  Once Levophed is considered, initiate post MI beta-blocker and ACE/ARB.  Smoking cessation is essential.  Findings Coronary Findings Diagnostic  Dominance: Right  Left Anterior Descending Ost LAD to Prox LAD lesion is 20% stenosed. Prox LAD to Mid LAD lesion is 20% stenosed.  Right Coronary Artery Prox RCA to Mid RCA lesion is 100% stenosed.  Right Ventricular Branch Vessel is small in size.  Right Posterior Descending Artery RPDA lesion is 100% stenosed.  Intervention  Prox RCA to Mid RCA lesion Stent A stent was successfully placed. Post-Intervention Lesion Assessment The intervention was successful. Pre-interventional TIMI flow is 0. Post-intervention TIMI flow is 3. No complications occurred at this lesion. There is a 0% residual stenosis post intervention.    ECHOCARDIOGRAM  ECHOCARDIOGRAM COMPLETE 09/27/2020  Narrative ECHOCARDIOGRAM REPORT    Patient Name:   Matthew Patel Date of Exam: 09/27/2020 Medical Rec #:  [id number]       Height:       68.0 in Accession #:    [id number]      Weight:       153.0 lb Date of Birth:  Jul 23, 1966       BSA:  1.824 m Patient Age:    54 years        BP:           128/84 mmHg Patient Gender: M               HR:           61 bpm. Exam Location:  [place]  Procedure: 2D Echo, 3D Echo, Cardiac Doppler and Color Doppler  Indications:    I25.5 Ischemic cardiomyopathy  History:        Patient has prior history of Echocardiogram examinations, most recent 07/04/2020. Cardiomyopathy, CAD and Previous Myocardial Infarction; Risk Factors:Dyslipidemia.  Sonographer:    Joaquim Nam Referring Phys: [phone] BRIAN J MUNLEY  IMPRESSIONS   1. Left ventricular ejection fraction, by estimation, is 55 to 60%. The left ventricle has normal function. The left ventricle demonstrates regional wall motion abnormalities (see scoring diagram/findings for description). Left ventricular diastolic parameters were  normal. 2. Right ventricular systolic function is normal. The right ventricular size is normal. 3. The mitral valve is normal in structure. No evidence of mitral valve regurgitation. No evidence of mitral stenosis. 4. The aortic valve is tricuspid. Aortic valve regurgitation is not visualized. No aortic stenosis is present. 5. The inferior vena cava is normal in size with greater than 50% respiratory variability, suggesting right atrial pressure of 3 mmHg.  FINDINGS Left Ventricle: Left ventricular ejection fraction, by estimation, is 55 to 60%. The left ventricle has normal function. The left ventricle demonstrates regional wall motion abnormalities. The left ventricular internal cavity size was normal in size. There is no left ventricular hypertrophy. Left ventricular diastolic parameters were normal. Normal left ventricular filling pressure.   LV Wall Scoring: The basal inferior segment is hypokinetic. The entire anterior wall, entire lateral wall, entire septum, entire apex, and mid and distal inferior wall are normal.  Right Ventricle: The right ventricular size is normal. No increase in right ventricular wall thickness. Right ventricular systolic function is normal.  Left Atrium: Left atrial size was normal in size.  Right Atrium: Right atrial size was normal in size.  Pericardium: There is no evidence of pericardial effusion.  Mitral Valve: The mitral valve is normal in structure. No evidence of mitral valve regurgitation. No evidence of mitral valve stenosis.  Tricuspid Valve: The tricuspid valve is normal in structure. Tricuspid valve regurgitation is not demonstrated. No evidence of tricuspid stenosis.  Aortic Valve: The aortic valve is tricuspid. Aortic valve regurgitation is not visualized. No aortic stenosis is present.  Pulmonic Valve: The pulmonic valve was normal in structure. Pulmonic valve regurgitation is not visualized. No evidence of pulmonic stenosis.  Aorta: The  aortic root, ascending aorta, aortic arch and descending aorta are all structurally normal, with no evidence of dilitation or obstruction.  Venous: A normal flow pattern is recorded from the right upper pulmonary vein. The inferior vena cava is normal in size with greater than 50% respiratory variability, suggesting right atrial pressure of 3 mmHg.  IAS/Shunts: No atrial level shunt detected by color flow Doppler.   LEFT VENTRICLE PLAX 2D LVIDd:         4.68 cm  Diastology LVIDs:         3.72 cm  LV e' medial:    7.62 cm/s LV PW:         1.01 cm  LV E/e' medial:  8.0 LV IVS:        0.88 cm  LV e' lateral:   11.20 cm/s  LVOT diam:     2.00 cm  LV E/e' lateral: 5.5 LV SV:         43 LV SV Index:   23 LVOT Area:     3.14 cm  3D Volume EF: 3D EF:        46 % LV EDV:       152 ml LV ESV:       82 ml LV SV:        70 ml  RIGHT VENTRICLE RV S prime:     9.46 cm/s TAPSE (M-mode): 1.9 cm  LEFT ATRIUM             Index       RIGHT ATRIUM          Index LA diam:        3.40 cm 1.86 cm/m  RA Area:     9.79 cm LA Vol (A2C):   28.0 ml 15.35 ml/m RA Volume:   17.60 ml 9.65 ml/m LA Vol (A4C):   20.5 ml 11.24 ml/m LA Biplane Vol: 24.3 ml 13.32 ml/m AORTIC VALVE LVOT Vmax:   71.10 cm/s LVOT Vmean:  50.300 cm/s LVOT VTI:    0.136 m  AORTA Ao Root diam: 3.20 cm Ao Asc diam:  3.00 cm  MITRAL VALVE MV Area (PHT): 5.02 cm    SHUNTS MV Decel Time: 151 msec    Systemic VTI:  0.14 m MV E velocity: 61.30 cm/s  Systemic Diam: 2.00 cm MV A velocity: 56.10 cm/s MV E/A ratio:  1.09  Norman Herrlich MD Electronically signed by Norman Herrlich MD Signature Date/Time: 09/27/2020/12:47:32 PM    Final             Risk Assessment/Calculations:             Physical Exam:   VS:  BP 132/74   Pulse 70   Ht 5\' 7"  (1.702 m)   Wt 166 lb (75.3 kg)   SpO2 96%   BMI 26.00 kg/m    Wt Readings from Last 3 Encounters:  08/25/23 166 lb (75.3 kg)  02/27/23 162 lb (73.5 kg)  01/02/23 161 lb 12.8  oz (73.4 kg)    GEN: Well nourished, well developed in no acute distress NECK: No JVD; No carotid bruits CARDIAC: RRR, no murmurs, rubs, gallops RESPIRATORY: Velcro-like crackles in the bases ABDOMEN: Soft, non-tender, non-distended EXTREMITIES:  No edema; No deformity   ASSESSMENT AND PLAN: .   CAD with chest pain-s/p DES to the RCA in 2021 in the setting of a STEMI >> repeat cath in 2022 revealed patent stent.  His symptoms today are concerning for angina.  Continue aspirin 81 mg daily, continue metoprolol 12.5 mg daily, continue amlodipine 2.5 mg daily, continue nitroglycerin--has not taken.  Reviewed how and when to take his nitroglycerin and when to present to the ED.  Discussed with DOD and his primary cardiologist Dr. Dulce Sellar, he is a rather stoic man and does not typically have any complaints, decision was made to proceed with with a cardiac catheterization.  Risk and benefits were explained including 1 in 200 chance of adverse bleeding, 1 in 500 chance of kidney failure, 1 in 1000 chance of stroke/death/MI.   Dyslipidemia-most recent LDL was controlled at 53 on 02/27/2023.  Continue Lipitor 40 mg daily.  COPD with prior tobacco abuse-this is listed in his chart however he says he has never been diagnosed with this, never been evaluated by pulmonologist.  Lung sounds reveal Velcro-like  crackles in the bases.  He has some shortness of breath however it is recently worsened-as she does not feel it is related to any lung issues, rather his CAD.  Will address this after his heart cath to see if we need to refer him to a pulmonologist.  Prior tobacco abuse with complete cessation since MI in 2021.    Informed Consent   Shared Decision Making/Informed Consent The risks [stroke (1 in 1000), death (1 in 1000), kidney failure [usually temporary] (1 in 500), bleeding (1 in 200), allergic reaction [possibly serious] (1 in 200)], benefits (diagnostic support and management of coronary artery disease) and  alternatives of a cardiac catheterization were discussed in detail with Mr. Yeargan and he is willing to proceed.     Dispo: Left heart cath, CBC, BMET. Keep f/u with Dr. Dulce Sellar.   Signed, Flossie Dibble, NP

## 2023-08-24 NOTE — Progress Notes (Unsigned)
Cardiology Office Note:  .   Date:  08/25/2023  ID:  Matthew Patel, DOB 1966/07/24, MRN [id number] PCP: [facility] Providers Cardiologist:  Norman Herrlich, MD     He was also seen by me during the office visit. He is well-known to me with a history of very complex heart disease overall is done well but recently is having increasing frequency of typical exertional angina but also episodes at rest and he has had nocturnal but awaken him from his sleep  In view of his clinical pattern known CAD I think he is best served by direct referral to coronary angiography over functional testing.  Risk options and benefits detailed informed consent obtained. History of Present Illness: .   Matthew Patel is a 58 y.o. male with a past medical history of HFimpEF, CAD, migraines, PAD--followed by [facility], COPD, GERD, dyslipidemia, prior tobacco use.  02/02/2021 left heart cath widely patent DES to the RCA 09/27/2020 echo EF 55 to 60%, positive RWMA 07/04/2020 echo EF 40 to 45%, positive RWMA, mild concentric LVH, mild hypokinesis of the LV with moderate hypokinesis of the LV basal-mid inferolateral wall 07/04/2020 left heart cath STEMI DES to the proximal to mid RCA  In 2021 he underwent a STEMI with subsequent DES to the RCA.  At that time his EF was diminished.  Subsequent left heart cath in 2022 revealed widely patent RCA stent.  Most recently evaluated by Dr. Dulce Sellar on 02/27/2023, he was stable from a cardiac perspective and advised he can follow-up in 6 months.  He presents today with concerns of chest pain.  This started approximately a month ago, mixed features--can occur with rest or exertion, most episodes he would describe as pressure radiating to his left arm and up the back of his neck.  He has not taken his nitroglycerin--he states he did not think that his episode had been severe enough to take them.  When his episodes of chest pain occur, he also endorses dizziness.  He states  he can hardly even wash dishes currently without feeling short of breath, which is a drastic change for him.  He has some nonspecific EKG changes today. He denies, pnd, orthopnea, n, v,  syncope, edema, weight gain, or early satiety.   ROS: Review of Systems  Constitutional:  Positive for malaise/fatigue.  Respiratory:  Positive for shortness of breath.   Cardiovascular:  Positive for chest pain and palpitations.  Neurological:  Positive for dizziness.  All other systems reviewed and are negative.    Studies Reviewed: Marland Kitchen   EKG Interpretation Date/Time:  Monday August 25 2023 10:48:59 EST Ventricular Rate:  70 PR Interval:  170 QRS Duration:  98 QT Interval:  394 QTC Calculation: 425 R Axis:   32  Text Interpretation: Normal sinus rhythm Inferior infarct (cited on or before 04-Jul-2020) Possible Anterior infarct , age undetermined Abnormal ECG When compared with ECG of 04-Jul-2020 04:22, Borderline criteria for Anterior infarct are now Present ST no longer depressed in Anterior leads T wave inversion less evident in Inferior leads Confirmed by Wallis Bamberg [phone]) on 08/25/2023 10:57:10 AM    Cardiac Studies & Procedures   CARDIAC CATHETERIZATION  CARDIAC CATHETERIZATION 02/02/2021  Narrative  Ost LAD to Prox LAD lesion is 10% stenosed.  Prox LAD to Mid LAD lesion is 20% stenosed.  Non-stenotic Prox RCA to Mid RCA lesion was previously treated.  The left ventricular systolic function is normal.  LV end diastolic pressure is normal.  The left ventricular ejection fraction is 55-65% by visual estimate.  Prox RCA lesion is 30% stenosed.  1.  Widely patent RCA stent with no significant restenosis.  Mild stenosis proximal to the RCA stent thought to be due to catheter induced spasm.  No obstructive disease affecting the left system. 2.  Normal LV systolic function and normal left ventricular end-diastolic pressure. 3.  Moderate difficulty of catheterization via the right radial  artery due to somewhat left-sided origin of the innominate artery with tortuosity.  Recommendations: Continue medical therapy.  Findings Coronary Findings Diagnostic  Dominance: Right  Left Anterior Descending Ost LAD to Prox LAD lesion is 10% stenosed. Prox LAD to Mid LAD lesion is 20% stenosed.  Right Coronary Artery Prox RCA lesion is 30% stenosed. Suspected catheter induced spasm Non-stenotic Prox RCA to Mid RCA lesion was previously treated.  Right Ventricular Branch Vessel is small in size.  Right Posterior Descending Artery  Intervention  No interventions have been documented.   CARDIAC CATHETERIZATION  CARDIAC CATHETERIZATION 07/04/2020  Narrative  Prox RCA to Mid RCA lesion is 100% stenosed.  RPDA lesion is 100% stenosed.  Ost LAD to Prox LAD lesion is 20% stenosed.  Prox LAD to Mid LAD lesion is 20% stenosed.  Post intervention, there is a 0% residual stenosis.  A stent was successfully placed.  Acute inferior ST segment elevation myocardial infarction secondary to thrombotic occlusion of the proximal to mid RCA with initial TIMI 0 flow.  Mild 20% ostial narrowing of the LAD and 20% mid LAD stenosis.  Normal left circumflex coronary artery.  Reperfusion mediated hypotension, idioventricular rhythm, requiring fluid resuscitation, IV amiodarone in addition to Levophed.  Aggrastat administration with thrombotic occlusion and probable embolized thrombus to the very distal PDA.  Successful percutaneous coronary intervention to the RCA with ultimate insertion of a 3.5 x 30 mm Resolute Onyx DES stent postdilated to 3.55 mm with the 100 % occlusion being reduced to 0% and restoration of brisk TIMI-3 flow.  RECOMMENDATION: DAPT for minimum of 1 year.  2D echo Doppler study will be done in a.m. to assess RV and LV function.  Continued fluid resuscitation with weaning and discontinuance of Levophed as blood pressure allows.  Aggressive lipid-lowering therapy  with target LDL less than 70.  Once Levophed is considered, initiate post MI beta-blocker and ACE/ARB.  Smoking cessation is essential.  Findings Coronary Findings Diagnostic  Dominance: Right  Left Anterior Descending Ost LAD to Prox LAD lesion is 20% stenosed. Prox LAD to Mid LAD lesion is 20% stenosed.  Right Coronary Artery Prox RCA to Mid RCA lesion is 100% stenosed.  Right Ventricular Branch Vessel is small in size.  Right Posterior Descending Artery RPDA lesion is 100% stenosed.  Intervention  Prox RCA to Mid RCA lesion Stent A stent was successfully placed. Post-Intervention Lesion Assessment The intervention was successful. Pre-interventional TIMI flow is 0. Post-intervention TIMI flow is 3. No complications occurred at this lesion. There is a 0% residual stenosis post intervention.    ECHOCARDIOGRAM  ECHOCARDIOGRAM COMPLETE 09/27/2020  Narrative ECHOCARDIOGRAM REPORT    Patient Name:   JOEVANNI RODDEY Date of Exam: 09/27/2020 Medical Rec #:  [id number]       Height:       68.0 in Accession #:    [id number]      Weight:       153.0 lb Date of Birth:  May 13, 1966       BSA:  1.824 m Patient Age:    54 years        BP:           128/84 mmHg Patient Gender: M               HR:           61 bpm. Exam Location:  [place]  Procedure: 2D Echo, 3D Echo, Cardiac Doppler and Color Doppler  Indications:    I25.5 Ischemic cardiomyopathy  History:        Patient has prior history of Echocardiogram examinations, most recent 07/04/2020. Cardiomyopathy, CAD and Previous Myocardial Infarction; Risk Factors:Dyslipidemia.  Sonographer:    Joaquim Nam Referring Phys: [phone] BRIAN J MUNLEY  IMPRESSIONS   1. Left ventricular ejection fraction, by estimation, is 55 to 60%. The left ventricle has normal function. The left ventricle demonstrates regional wall motion abnormalities (see scoring diagram/findings for description). Left ventricular diastolic parameters were  normal. 2. Right ventricular systolic function is normal. The right ventricular size is normal. 3. The mitral valve is normal in structure. No evidence of mitral valve regurgitation. No evidence of mitral stenosis. 4. The aortic valve is tricuspid. Aortic valve regurgitation is not visualized. No aortic stenosis is present. 5. The inferior vena cava is normal in size with greater than 50% respiratory variability, suggesting right atrial pressure of 3 mmHg.  FINDINGS Left Ventricle: Left ventricular ejection fraction, by estimation, is 55 to 60%. The left ventricle has normal function. The left ventricle demonstrates regional wall motion abnormalities. The left ventricular internal cavity size was normal in size. There is no left ventricular hypertrophy. Left ventricular diastolic parameters were normal. Normal left ventricular filling pressure.   LV Wall Scoring: The basal inferior segment is hypokinetic. The entire anterior wall, entire lateral wall, entire septum, entire apex, and mid and distal inferior wall are normal.  Right Ventricle: The right ventricular size is normal. No increase in right ventricular wall thickness. Right ventricular systolic function is normal.  Left Atrium: Left atrial size was normal in size.  Right Atrium: Right atrial size was normal in size.  Pericardium: There is no evidence of pericardial effusion.  Mitral Valve: The mitral valve is normal in structure. No evidence of mitral valve regurgitation. No evidence of mitral valve stenosis.  Tricuspid Valve: The tricuspid valve is normal in structure. Tricuspid valve regurgitation is not demonstrated. No evidence of tricuspid stenosis.  Aortic Valve: The aortic valve is tricuspid. Aortic valve regurgitation is not visualized. No aortic stenosis is present.  Pulmonic Valve: The pulmonic valve was normal in structure. Pulmonic valve regurgitation is not visualized. No evidence of pulmonic stenosis.  Aorta: The  aortic root, ascending aorta, aortic arch and descending aorta are all structurally normal, with no evidence of dilitation or obstruction.  Venous: A normal flow pattern is recorded from the right upper pulmonary vein. The inferior vena cava is normal in size with greater than 50% respiratory variability, suggesting right atrial pressure of 3 mmHg.  IAS/Shunts: No atrial level shunt detected by color flow Doppler.   LEFT VENTRICLE PLAX 2D LVIDd:         4.68 cm  Diastology LVIDs:         3.72 cm  LV e' medial:    7.62 cm/s LV PW:         1.01 cm  LV E/e' medial:  8.0 LV IVS:        0.88 cm  LV e' lateral:   11.20 cm/s  LVOT diam:     2.00 cm  LV E/e' lateral: 5.5 LV SV:         43 LV SV Index:   23 LVOT Area:     3.14 cm  3D Volume EF: 3D EF:        46 % LV EDV:       152 ml LV ESV:       82 ml LV SV:        70 ml  RIGHT VENTRICLE RV S prime:     9.46 cm/s TAPSE (M-mode): 1.9 cm  LEFT ATRIUM             Index       RIGHT ATRIUM          Index LA diam:        3.40 cm 1.86 cm/m  RA Area:     9.79 cm LA Vol (A2C):   28.0 ml 15.35 ml/m RA Volume:   17.60 ml 9.65 ml/m LA Vol (A4C):   20.5 ml 11.24 ml/m LA Biplane Vol: 24.3 ml 13.32 ml/m AORTIC VALVE LVOT Vmax:   71.10 cm/s LVOT Vmean:  50.300 cm/s LVOT VTI:    0.136 m  AORTA Ao Root diam: 3.20 cm Ao Asc diam:  3.00 cm  MITRAL VALVE MV Area (PHT): 5.02 cm    SHUNTS MV Decel Time: 151 msec    Systemic VTI:  0.14 m MV E velocity: 61.30 cm/s  Systemic Diam: 2.00 cm MV A velocity: 56.10 cm/s MV E/A ratio:  1.09  Norman Herrlich MD Electronically signed by Norman Herrlich MD Signature Date/Time: 09/27/2020/12:47:32 PM    Final             Risk Assessment/Calculations:             Physical Exam:   VS:  BP 132/74   Pulse 70   Ht 5\' 7"  (1.702 m)   Wt 166 lb (75.3 kg)   SpO2 96%   BMI 26.00 kg/m    Wt Readings from Last 3 Encounters:  08/25/23 166 lb (75.3 kg)  02/27/23 162 lb (73.5 kg)  01/02/23 161 lb 12.8  oz (73.4 kg)    GEN: Well nourished, well developed in no acute distress NECK: No JVD; No carotid bruits CARDIAC: RRR, no murmurs, rubs, gallops RESPIRATORY: Velcro-like crackles in the bases ABDOMEN: Soft, non-tender, non-distended EXTREMITIES:  No edema; No deformity   ASSESSMENT AND PLAN: .   CAD with chest pain-s/p DES to the RCA in 2021 in the setting of a STEMI >> repeat cath in 2022 revealed patent stent.  His symptoms today are concerning for angina.  Continue aspirin 81 mg daily, continue metoprolol 12.5 mg daily, continue amlodipine 2.5 mg daily, continue nitroglycerin--has not taken.  Reviewed how and when to take his nitroglycerin and when to present to the ED.  Discussed with DOD and his primary cardiologist Dr. Dulce Sellar, he is a rather stoic man and does not typically have any complaints, decision was made to proceed with with a cardiac catheterization.  Risk and benefits were explained including 1 in 200 chance of adverse bleeding, 1 in 500 chance of kidney failure, 1 in 1000 chance of stroke/death/MI.   Dyslipidemia-most recent LDL was controlled at 53 on 02/27/2023.  Continue Lipitor 40 mg daily.  COPD with prior tobacco abuse-this is listed in his chart however he says he has never been diagnosed with this, never been evaluated by pulmonologist.  Lung sounds reveal Velcro-like  crackles in the bases.  He has some shortness of breath however it is recently worsened-as she does not feel it is related to any lung issues, rather his CAD.  Will address this after his heart cath to see if we need to refer him to a pulmonologist.  Prior tobacco abuse with complete cessation since MI in 2021.    Informed Consent   Shared Decision Making/Informed Consent The risks [stroke (1 in 1000), death (1 in 1000), kidney failure [usually temporary] (1 in 500), bleeding (1 in 200), allergic reaction [possibly serious] (1 in 200)], benefits (diagnostic support and management of coronary artery disease) and  alternatives of a cardiac catheterization were discussed in detail with Mr. Ferg and he is willing to proceed.     Dispo: Left heart cath, CBC, BMET. Keep f/u with Dr. Dulce Sellar.   Signed, Flossie Dibble, NP

## 2023-08-25 ENCOUNTER — Ambulatory Visit: Payer: Managed Care, Other (non HMO) | Attending: Cardiology | Admitting: Cardiology

## 2023-08-25 ENCOUNTER — Encounter: Payer: Self-pay | Admitting: Cardiology

## 2023-08-25 VITALS — BP 132/74 | HR 70 | Ht 67.0 in | Wt 166.0 lb

## 2023-08-25 DIAGNOSIS — I255 Ischemic cardiomyopathy: Secondary | ICD-10-CM | POA: Diagnosis not present

## 2023-08-25 DIAGNOSIS — J449 Chronic obstructive pulmonary disease, unspecified: Secondary | ICD-10-CM

## 2023-08-25 DIAGNOSIS — Z01812 Encounter for preprocedural laboratory examination: Secondary | ICD-10-CM

## 2023-08-25 DIAGNOSIS — I2510 Atherosclerotic heart disease of native coronary artery without angina pectoris: Secondary | ICD-10-CM | POA: Diagnosis not present

## 2023-08-25 DIAGNOSIS — E782 Mixed hyperlipidemia: Secondary | ICD-10-CM | POA: Diagnosis not present

## 2023-08-25 DIAGNOSIS — R079 Chest pain, unspecified: Secondary | ICD-10-CM

## 2023-08-25 NOTE — Patient Instructions (Signed)
Medication Instructions:  Your physician recommends that you continue on your current medications as directed. Please refer to the Current Medication list given to you today.  *If you need a refill on your cardiac medications before your next appointment, please call your pharmacy*   Lab Work: Your physician recommends that you return for lab work in: Today for CBC and BMP  If you have labs (blood work) drawn today and your tests are completely normal, you will receive your results only by: MyChart Message (if you have MyChart) OR A paper copy in the mail If you have any lab test that is abnormal or we need to change your treatment, we will call you to review the results.   Testing/Procedures:  [place] [facility] DEPT OF [facility] [place] [address] [place] [address] Dept: [phone] Loc: [phone]  Anthonny Schiller [place]  08/25/2023  You are scheduled for a Cardiac Catheterization on Wednesday, January 29 with Dr. Tonny Bollman.  1. Please arrive at the [facility] (Main Entrance A) at [facility]: [address] [place], [place] [address] at 12:30 PM (This time is 2 hour(s) before your procedure to ensure your preparation).   Free valet parking service is available. You will check in at ADMITTING. The support person will be asked to wait in the waiting room.  It is OK to have someone drop you off and come back when you are ready to be discharged.    Special note: Every effort is made to have your procedure done on time. Please understand that emergencies sometimes delay scheduled procedures.  2. Diet: Do not eat solid foods after midnight.  The patient may have clear liquids until 5am upon the day of the procedure.   4. Medication instructions in preparation for your procedure:   Contrast Allergy: No   On the morning of your procedure, take your Aspirin 81 mg and any morning medicines NOT listed above.   You may use sips of water.  5. Plan to go home the same day, you will only stay overnight if medically necessary. 6. Bring a current list of your medications and current insurance cards. 7. You MUST have a responsible person to drive you home. 8. Someone MUST be with you the first 24 hours after you arrive home or your discharge will be delayed. 9. Please wear clothes that are easy to get on and off and wear slip-on shoes.  Thank you for allowing [place] to care for you!   -- [facility]    Follow-Up: At [facility], you and your health needs are our priority.  As part of our continuing mission to provide you with exceptional heart care, we have created designated Provider Care Teams.  These Care Teams include your primary Cardiologist (physician) and Advanced Practice Providers (APPs -  Physician Assistants and Nurse Practitioners) who all work together to provide you with the care you need, when you need it.  We recommend signing up for the patient portal called "MyChart".  Sign up information is provided on this After Visit Summary.  MyChart is used to connect with patients for Virtual Visits (Telemedicine).  Patients are able to view lab/test results, encounter notes, upcoming appointments, etc.  Non-urgent messages can be sent to your provider as well.   To learn more about what you can do with MyChart, go to [email or web address].    Your next appointment:  Feb. 4th  at 3:20 with Dr. Dulce Sellar  Provider:   Norman Herrlich, MD    Other Instructions

## 2023-08-26 ENCOUNTER — Telehealth: Payer: Self-pay | Admitting: *Deleted

## 2023-08-26 LAB — CBC WITH DIFFERENTIAL/PLATELET
Basophils Absolute: 0.1 x10E3/uL (ref 0.0–0.2)
Basos: 1 %
EOS (ABSOLUTE): 0.2 x10E3/uL (ref 0.0–0.4)
Eos: 3 %
Hematocrit: 41.1 % (ref 37.5–51.0)
Hemoglobin: 13.9 g/dL (ref 13.0–17.7)
Immature Grans (Abs): 0 x10E3/uL (ref 0.0–0.1)
Immature Granulocytes: 0 %
Lymphocytes Absolute: 2.6 x10E3/uL (ref 0.7–3.1)
Lymphs: 42 %
MCH: 31 pg (ref 26.6–33.0)
MCHC: 33.8 g/dL (ref 31.5–35.7)
MCV: 92 fL (ref 79–97)
Monocytes Absolute: 0.4 x10E3/uL (ref 0.1–0.9)
Monocytes: 7 %
Neutrophils Absolute: 3 x10E3/uL (ref 1.4–7.0)
Neutrophils: 47 %
Platelets: 269 x10E3/uL (ref 150–450)
RBC: 4.48 x10E6/uL (ref 4.14–5.80)
RDW: 12.2 % (ref 11.6–15.4)
WBC: 6.3 x10E3/uL (ref 3.4–10.8)

## 2023-08-26 LAB — BASIC METABOLIC PANEL WITH GFR
BUN/Creatinine Ratio: 12 (ref 9–20)
BUN: 11 mg/dL (ref 6–24)
CO2: 23 mmol/L (ref 20–29)
Calcium: 9.6 mg/dL (ref 8.7–10.2)
Chloride: 102 mmol/L (ref 96–106)
Creatinine, Ser: 0.9 mg/dL (ref 0.76–1.27)
Glucose: 88 mg/dL (ref 70–99)
Potassium: 3.9 mmol/L (ref 3.5–5.2)
Sodium: 140 mmol/L (ref 134–144)
eGFR: 101 mL/min/1.73

## 2023-08-26 NOTE — Telephone Encounter (Signed)
Cardiac Catheterization scheduled at [facility] for: Wednesday August 27, 2023 2:30 PM Arrival time [facility] Main Entrance A at: 12:30 PM  Nothing to eat after midnight prior to procedure, clear liquids until 7 AM day of procedure.  Medication instructions: -Usual morning medications can be taken with sips of water including aspirin 81 mg.  Plan to go home the same day, you will only stay overnight if medically necessary.  You must have responsible adult to drive you home.  Someone must be with you the first 24 hours after you arrive home.  Reviewed procedure instructions with patient's girlfriend (DPR), [place].

## 2023-08-27 ENCOUNTER — Encounter (HOSPITAL_COMMUNITY)
Admission: RE | Disposition: A | Discharge status: Home / Self Care | Payer: Self-pay | Source: Ambulatory Visit | Attending: Cardiovascular Disease

## 2023-08-27 ENCOUNTER — Ambulatory Visit (HOSPITAL_COMMUNITY)
Admission: RE | Admit: 2023-08-27 | Discharge: 2023-08-27 | Disposition: A | Discharge status: Home / Self Care | Payer: Managed Care, Other (non HMO) | Source: Ambulatory Visit | Attending: Cardiovascular Disease | Admitting: Cardiovascular Disease

## 2023-08-27 ENCOUNTER — Other Ambulatory Visit: Payer: Self-pay

## 2023-08-27 DIAGNOSIS — I25119 Atherosclerotic heart disease of native coronary artery with unspecified angina pectoris: Secondary | ICD-10-CM | POA: Insufficient documentation

## 2023-08-27 DIAGNOSIS — Z87891 Personal history of nicotine dependence: Secondary | ICD-10-CM | POA: Diagnosis not present

## 2023-08-27 DIAGNOSIS — I252 Old myocardial infarction: Secondary | ICD-10-CM | POA: Diagnosis not present

## 2023-08-27 DIAGNOSIS — Z7982 Long term (current) use of aspirin: Secondary | ICD-10-CM | POA: Insufficient documentation

## 2023-08-27 DIAGNOSIS — I2510 Atherosclerotic heart disease of native coronary artery without angina pectoris: Secondary | ICD-10-CM

## 2023-08-27 DIAGNOSIS — Z79899 Other long term (current) drug therapy: Secondary | ICD-10-CM | POA: Insufficient documentation

## 2023-08-27 DIAGNOSIS — E785 Hyperlipidemia, unspecified: Secondary | ICD-10-CM | POA: Diagnosis not present

## 2023-08-27 DIAGNOSIS — K219 Gastro-esophageal reflux disease without esophagitis: Secondary | ICD-10-CM | POA: Insufficient documentation

## 2023-08-27 DIAGNOSIS — R079 Chest pain, unspecified: Secondary | ICD-10-CM

## 2023-08-27 DIAGNOSIS — Z955 Presence of coronary angioplasty implant and graft: Secondary | ICD-10-CM | POA: Diagnosis not present

## 2023-08-27 DIAGNOSIS — I5032 Chronic diastolic (congestive) heart failure: Secondary | ICD-10-CM | POA: Diagnosis not present

## 2023-08-27 DIAGNOSIS — J449 Chronic obstructive pulmonary disease, unspecified: Secondary | ICD-10-CM | POA: Diagnosis not present

## 2023-08-27 HISTORY — PX: LEFT HEART CATH AND CORONARY ANGIOGRAPHY: CATH118249

## 2023-08-27 SURGERY — LEFT HEART CATH AND CORONARY ANGIOGRAPHY
Anesthesia: LOCAL

## 2023-08-27 MED ORDER — LIDOCAINE HCL (PF) 1 % IJ SOLN
INTRAMUSCULAR | Status: DC | PRN
  Administered 2023-08-27: 2 mL via INTRADERMAL

## 2023-08-27 MED ORDER — SODIUM CHLORIDE 0.9 % IV SOLN: 250.0000 mL | INTRAVENOUS | Status: DC | PRN

## 2023-08-27 MED ORDER — HYDRALAZINE HCL 20 MG/ML IJ SOLN: 10.0000 mg | INTRAMUSCULAR | Status: DC | PRN

## 2023-08-27 MED ORDER — SODIUM CHLORIDE 0.9 % WEIGHT BASED INFUSION: 3.0000 mL/kg/h | INTRAVENOUS | Status: AC

## 2023-08-27 MED ORDER — LABETALOL HCL 5 MG/ML IV SOLN: 10.0000 mg | INTRAVENOUS | Status: DC | PRN

## 2023-08-27 MED ORDER — SODIUM CHLORIDE 0.9% FLUSH: 3.0000 mL | INTRAVENOUS | Status: DC | PRN

## 2023-08-27 MED ORDER — VERAPAMIL HCL 2.5 MG/ML IV SOLN
INTRAVENOUS | Status: DC | PRN
  Administered 2023-08-27: 10 mL via INTRA_ARTERIAL

## 2023-08-27 MED ORDER — SODIUM CHLORIDE 0.9 % WEIGHT BASED INFUSION: 1.0000 mL/kg/h | INTRAVENOUS | Status: DC

## 2023-08-27 MED ORDER — IOHEXOL 350 MG/ML SOLN
INTRAVENOUS | Status: DC | PRN
  Administered 2023-08-27: 18 mL

## 2023-08-27 MED ORDER — FENTANYL CITRATE (PF) 100 MCG/2ML IJ SOLN
INTRAMUSCULAR | Status: DC | PRN
  Administered 2023-08-27: 25 ug via INTRAVENOUS

## 2023-08-27 MED ORDER — FENTANYL CITRATE (PF) 100 MCG/2ML IJ SOLN
INTRAMUSCULAR | Status: AC
  Filled 2023-08-27: qty 2

## 2023-08-27 MED ORDER — ASPIRIN 81 MG PO CHEW: 81.0000 mg | CHEWABLE_TABLET | ORAL | Status: DC

## 2023-08-27 MED ORDER — MIDAZOLAM HCL 2 MG/2ML IJ SOLN
INTRAMUSCULAR | Status: AC
  Filled 2023-08-27: qty 2

## 2023-08-27 MED ORDER — HEPARIN SODIUM (PORCINE) 1000 UNIT/ML IJ SOLN
INTRAMUSCULAR | Status: AC
  Filled 2023-08-27: qty 10

## 2023-08-27 MED ORDER — HEPARIN SODIUM (PORCINE) 1000 UNIT/ML IJ SOLN
INTRAMUSCULAR | Status: DC | PRN
  Administered 2023-08-27: 4000 [IU] via INTRAVENOUS

## 2023-08-27 MED ORDER — MIDAZOLAM HCL 2 MG/2ML IJ SOLN
INTRAMUSCULAR | Status: DC | PRN
  Administered 2023-08-27: 2 mg via INTRAVENOUS

## 2023-08-27 MED ORDER — ONDANSETRON HCL 4 MG/2ML IJ SOLN: 4.0000 mg | Freq: Four times a day (QID) | INTRAMUSCULAR | Status: DC | PRN

## 2023-08-27 MED ORDER — HEPARIN (PORCINE) IN NACL 1000-0.9 UT/500ML-% IV SOLN
INTRAVENOUS | Status: DC | PRN
  Administered 2023-08-27 (×2): 500 mL

## 2023-08-27 MED ORDER — ACETAMINOPHEN 325 MG PO TABS: 650.0000 mg | ORAL_TABLET | ORAL | Status: DC | PRN

## 2023-08-27 MED ORDER — SODIUM CHLORIDE 0.9% FLUSH
3.0000 mL | Freq: Two times a day (BID) | INTRAVENOUS | Status: DC
Start: 2023-08-27 — End: 2023-08-27

## 2023-08-27 SURGICAL SUPPLY — 8 items
CATH INFINITI 5FR JL4 (CATHETERS) IMPLANT
CATH INFINITI JR4 5F (CATHETERS) IMPLANT
DEVICE RAD COMP TR BAND LRG (VASCULAR PRODUCTS) IMPLANT
GLIDESHEATH SLEND SS 6F .021 (SHEATH) IMPLANT
GUIDEWIRE INQWIRE 1.5J.035X260 (WIRE) IMPLANT
INQWIRE 1.5J .035X260CM (WIRE) ×1
PACK CARDIAC CATHETERIZATION (CUSTOM PROCEDURE TRAY) ×1 IMPLANT
SET ATX-X65L (MISCELLANEOUS) IMPLANT

## 2023-08-27 NOTE — Interval H&P Note (Signed)
History and Physical Interval Note:  08/27/2023 9:56 AM  Matthew Patel  has presented today for surgery, with the diagnosis of chest pain.  The various methods of treatment have been discussed with the patient and family. After consideration of risks, benefits and other options for treatment, the patient has consented to  Procedure(s): LEFT HEART CATH AND CORONARY ANGIOGRAPHY (N/A) as a surgical intervention.  The patient's history has been reviewed, patient examined, no change in status, stable for surgery.  I have reviewed the patient's chart and labs.  Questions were answered to the patient's satisfaction.     Tonny Bollman

## 2023-08-27 NOTE — Discharge Instructions (Signed)

## 2023-08-28 ENCOUNTER — Encounter (HOSPITAL_COMMUNITY): Payer: Self-pay | Admitting: Cardiovascular Disease

## 2023-09-01 NOTE — Progress Notes (Signed)
 Cardiology Office Note:    Date:  09/02/2023   ID:  Matthew Patel, DOB [birth year], MRN [id number]  PCP:  Charlann Knee, PA  Cardiologist:  Redell Leiter, MD    Referring MD: No ref. provider found    ASSESSMENT:    1. CAD in native artery   2. Cardiomyopathy, ischemic   3. Mixed hyperlipidemia   4. Chronic obstructive pulmonary disease, unspecified COPD type (HCC)   5. PAD (peripheral artery disease) (HCC)    PLAN:    In order of problems listed above:  Reassuring coronary angiogram does not have severe obstructive CAD patent stent but continues to have anginal symptoms Continue his medical therapy beta-blocker calcium  channel blocker aspirin  statin Seems like essential part of his symptom is intermittent rapid heart rhythm apply monitor life if we have 1 to capture and increase his beta-blocker Continue his current lipid-lowering therapy Stable COPD and PAD   Next appointment: 3 months   Medication Adjustments/Labs and Tests Ordered: Current medicines are reviewed at length with the patient today.  Concerns regarding medicines are outlined above.  No orders of the defined types were placed in this encounter.  No orders of the defined types were placed in this encounter.    History of Present Illness:    Matthew Patel is a 58 y.o. male with a hx of CAD with previous and reduced ejection fraction normalized with guideline directed therapy hyperlipidemia and peripheral arterial disease along with COPD last seen 08/24/2022.  Left heart catheterization 08/26/2022 which showed patent stent proximal right coronary artery and no obstructive CAD requiring intervention.  Compliance with diet, lifestyle and medications: Yes  His wife is present they remain very unsettled he is having frequent episodes of chest pain that he describes as tightness and pressure in the left chest quick onset resolved spontaneously has not needed nitroglycerin  Not pleuritic not short of breath no  chest wall trauma and not positional Then he tells me he is having episodes of rapid heart rhythm associated with that  Will apply a monitor today live if possible try to capture and increase his beta-blocker awaiting results. Continue his medical therapy including aspirin  calcium  channel blocker amlodipine  and his high intensity statin Past Medical History:  Diagnosis Date   Atherosclerotic heart disease of native coronary artery with other forms of angina pectoris (HCC)    CAD in native artery 07/05/2020   Cardiomyopathy, ischemic 07/05/2020   COPD (chronic obstructive pulmonary disease) (HCC) 11/12/2019   GERD (gastroesophageal reflux disease)    Migraines    Mixed hyperlipidemia    PAD (peripheral artery disease) (HCC) 02/12/2022   Plantar fasciitis of left foot 02/08/2020   Psoriasis 02/12/2022   S/P angioplasty with stent RCA DES 07/04/20 07/05/2020   Tarsal tunnel syndrome of left side 02/08/2020   Tendonitis, Achilles, left 02/08/2020   Tension type headache 04/04/2015    Current Medications: Current Meds  Medication Sig   acetaminophen  (TYLENOL ) 500 MG tablet Take 1,000 mg by mouth every 6 (six) hours as needed for moderate pain (pain score 4-6).   amLODipine  (NORVASC ) 5 MG tablet Take 5 mg by mouth daily as needed (high bp).   aspirin  81 MG chewable tablet Chew 1 tablet (81 mg total) by mouth daily.   atorvastatin  (LIPITOR ) 40 MG tablet Take 1 tablet (40 mg total) by mouth daily.   Camphor-Eucalyptus-Menthol (VICKS VAPORUB EX) Apply 1 Application topically daily as needed (congestion).   COSENTYX SENSOREADY, 300 MG, 150 MG/ML SOAJ Inject 2  Syringes into the skin every 28 (twenty-eight) days.   diphenhydramine-acetaminophen  (TYLENOL  PM) 25-500 MG TABS tablet Take 2 tablets by mouth at bedtime as needed.   fluticasone (FLONASE) 50 MCG/ACT nasal spray Place 1-2 sprays into both nostrils daily as needed for allergies or rhinitis.   Menthol, Topical Analgesic, (MINERAL ICE EX)  Apply 1 application topically daily as needed (foot pain).   metoprolol  succinate (TOPROL -XL) 25 MG 24 hr tablet Take 0.5 tablets (12.5 mg total) by mouth daily.   nitroGLYCERIN  (NITROSTAT ) 0.4 MG SL tablet Place 1 tablet (0.4 mg total) under the tongue every 5 (five) minutes as needed for chest pain.   omeprazole  (PRILOSEC) 40 MG capsule TAKE 1 CAPSULE BY MOUTH TWICE DAILY BEFORE A MEAL   Current Facility-Administered Medications for the 09/02/23 encounter (Office Visit) with Monetta Redell PARAS, MD  Medication   sodium chloride  flush (NS) 0.9 % injection 3 mL      EKGs/Labs/Other Studies Reviewed:    The following studies were reviewed today:  Cardiac Studies & Procedures   CARDIAC CATHETERIZATION  CARDIAC CATHETERIZATION 08/27/2023  Narrative 1.  Widely patent left main with no stenosis 2.  Widely patent LAD with mild irregularity but no significant stenosis.  Patent diagonal branches. 3.  Patent circumflex supplying a single OM branch with no stenosis 4.  Large, dominant RCA with widely patent proximal stent, no in-stent restenosis, mild plaquing proximal to the stent with no more than 30% stenosis  Recommendations: Ongoing medical therapy.  The patient has widely patent coronary arteries with no obstructive disease.  Findings Coronary Findings Diagnostic  Dominance: Right  Left Anterior Descending There is mild diffuse disease throughout the vessel. Ost LAD to Prox LAD lesion is 10% stenosed. Prox LAD to Mid LAD lesion is 20% stenosed.  Left Circumflex Prox Cx to Mid Cx lesion is 30% stenosed.  Right Coronary Artery Ost RCA to Prox RCA lesion is 30% stenosed. Suspected catheter induced spasm Non-stenotic Prox RCA to Mid RCA lesion was previously treated.  Right Ventricular Branch Vessel is small in size.  Right Posterior Descending Artery  Intervention  No interventions have been documented.   CARDIAC CATHETERIZATION  CARDIAC CATHETERIZATION  02/02/2021  Narrative  Ost LAD to Prox LAD lesion is 10% stenosed.  Prox LAD to Mid LAD lesion is 20% stenosed.  Non-stenotic Prox RCA to Mid RCA lesion was previously treated.  The left ventricular systolic function is normal.  LV end diastolic pressure is normal.  The left ventricular ejection fraction is 55-65% by visual estimate.  Prox RCA lesion is 30% stenosed.  1.  Widely patent RCA stent with no significant restenosis.  Mild stenosis proximal to the RCA stent thought to be due to catheter induced spasm.  No obstructive disease affecting the left system. 2.  Normal LV systolic function and normal left ventricular end-diastolic pressure. 3.  Moderate difficulty of catheterization via the right radial artery due to somewhat left-sided origin of the innominate artery with tortuosity.  Recommendations: Continue medical therapy.  Findings Coronary Findings Diagnostic  Dominance: Right  Left Anterior Descending Ost LAD to Prox LAD lesion is 10% stenosed. Prox LAD to Mid LAD lesion is 20% stenosed.  Right Coronary Artery Prox RCA lesion is 30% stenosed. Suspected catheter induced spasm Non-stenotic Prox RCA to Mid RCA lesion was previously treated.  Right Ventricular Branch Vessel is small in size.  Right Posterior Descending Artery  Intervention  No interventions have been documented.    ECHOCARDIOGRAM  ECHOCARDIOGRAM COMPLETE 09/27/2020  Narrative ECHOCARDIOGRAM REPORT    Patient Name:   Matthew Patel Date of Exam: 09/27/2020 Medical Rec #:  [id number]       Height:       68.0 in Accession #:    [id number]      Weight:       153.0 lb Date of Birth:  26-Jan-1966       BSA:          1.824 m Patient Age:    58 years        BP:           128/84 mmHg Patient Gender: M               HR:           61 bpm. Exam Location:  [place]  Procedure: 2D Echo, 3D Echo, Cardiac Doppler and Color Doppler  Indications:    I25.5 Ischemic cardiomyopathy  History:         Patient has prior history of Echocardiogram examinations, most recent 07/04/2020. Cardiomyopathy, CAD and Previous Myocardial Infarction; Risk Factors:Dyslipidemia.  Sonographer:    Apolinar Levins Referring Phys: [phone] Kinsey Karch J Johnell Landowski  IMPRESSIONS   1. Left ventricular ejection fraction, by estimation, is 55 to 60%. The left ventricle has normal function. The left ventricle demonstrates regional wall motion abnormalities (see scoring diagram/findings for description). Left ventricular diastolic parameters were normal. 2. Right ventricular systolic function is normal. The right ventricular size is normal. 3. The mitral valve is normal in structure. No evidence of mitral valve regurgitation. No evidence of mitral stenosis. 4. The aortic valve is tricuspid. Aortic valve regurgitation is not visualized. No aortic stenosis is present. 5. The inferior vena cava is normal in size with greater than 50% respiratory variability, suggesting right atrial pressure of 3 mmHg.  FINDINGS Left Ventricle: Left ventricular ejection fraction, by estimation, is 55 to 60%. The left ventricle has normal function. The left ventricle demonstrates regional wall motion abnormalities. The left ventricular internal cavity size was normal in size. There is no left ventricular hypertrophy. Left ventricular diastolic parameters were normal. Normal left ventricular filling pressure.   LV Wall Scoring: The basal inferior segment is hypokinetic. The entire anterior wall, entire lateral wall, entire septum, entire apex, and mid and distal inferior wall are normal.  Right Ventricle: The right ventricular size is normal. No increase in right ventricular wall thickness. Right ventricular systolic function is normal.  Left Atrium: Left atrial size was normal in size.  Right Atrium: Right atrial size was normal in size.  Pericardium: There is no evidence of pericardial effusion.  Mitral Valve: The mitral valve is normal in  structure. No evidence of mitral valve regurgitation. No evidence of mitral valve stenosis.  Tricuspid Valve: The tricuspid valve is normal in structure. Tricuspid valve regurgitation is not demonstrated. No evidence of tricuspid stenosis.  Aortic Valve: The aortic valve is tricuspid. Aortic valve regurgitation is not visualized. No aortic stenosis is present.  Pulmonic Valve: The pulmonic valve was normal in structure. Pulmonic valve regurgitation is not visualized. No evidence of pulmonic stenosis.  Aorta: The aortic root, ascending aorta, aortic arch and descending aorta are all structurally normal, with no evidence of dilitation or obstruction.  Venous: A normal flow pattern is recorded from the right upper pulmonary vein. The inferior vena cava is normal in size with greater than 50% respiratory variability, suggesting right atrial pressure of 3 mmHg.  IAS/Shunts: No atrial level shunt detected by color  flow Doppler.   LEFT VENTRICLE PLAX 2D LVIDd:         4.68 cm  Diastology LVIDs:         3.72 cm  LV e' medial:    7.62 cm/s LV PW:         1.01 cm  LV E/e' medial:  8.0 LV IVS:        0.88 cm  LV e' lateral:   11.20 cm/s LVOT diam:     2.00 cm  LV E/e' lateral: 5.5 LV SV:         43 LV SV Index:   23 LVOT Area:     3.14 cm  3D Volume EF: 3D EF:        46 % LV EDV:       152 ml LV ESV:       82 ml LV SV:        70 ml  RIGHT VENTRICLE RV S prime:     9.46 cm/s TAPSE (M-mode): 1.9 cm  LEFT ATRIUM             Index       RIGHT ATRIUM          Index LA diam:        3.40 cm 1.86 cm/m  RA Area:     9.79 cm LA Vol (A2C):   28.0 ml 15.35 ml/m RA Volume:   17.60 ml 9.65 ml/m LA Vol (A4C):   20.5 ml 11.24 ml/m LA Biplane Vol: 24.3 ml 13.32 ml/m AORTIC VALVE LVOT Vmax:   71.10 cm/s LVOT Vmean:  50.300 cm/s LVOT VTI:    0.136 m  AORTA Ao Root diam: 3.20 cm Ao Asc diam:  3.00 cm  MITRAL VALVE MV Area (PHT): 5.02 cm    SHUNTS MV Decel Time: 151 msec    Systemic VTI:   0.14 m MV E velocity: 61.30 cm/s  Systemic Diam: 2.00 cm MV A velocity: 56.10 cm/s MV E/A ratio:  1.09  Redell Leiter MD Electronically signed by Redell Leiter MD Signature Date/Time: 09/27/2020/12:47:32 PM    Final                 Recent Labs: 02/27/2023: ALT 25 08/25/2023: BUN 11; Creatinine, Ser 0.90; Hemoglobin 13.9; Platelets 269; Potassium 3.9; Sodium 140  Recent Lipid Panel    Component Value Date/Time   CHOL 114 02/27/2023 1631   TRIG 116 02/27/2023 1631   HDL 40 02/27/2023 1631   CHOLHDL 2.9 02/27/2023 1631   CHOLHDL 5.7 07/04/2020 0200   VLDL 35 07/04/2020 0200   LDLCALC 53 02/27/2023 1631    Physical Exam:    VS:  BP 128/82   Pulse 83   Ht 5' 7 (1.702 m)   Wt 166 lb 6.4 oz (75.5 kg)   SpO2 95%   BMI 26.06 kg/m     Wt Readings from Last 3 Encounters:  09/02/23 166 lb 6.4 oz (75.5 kg)  08/27/23 166 lb (75.3 kg)  08/25/23 166 lb (75.3 kg)     GEN:  Well nourished, well developed in no acute distress he does have chest wall deformity and scoliosis he has no chest wall tenderness HEENT: Normal NECK: No JVD; No carotid bruits LYMPHATICS: No lymphadenopathy CARDIAC: RRR, no murmurs, rubs, gallops RESPIRATORY:  Clear to auscultation without rales, wheezing or rhonchi  ABDOMEN: Soft, non-tender, non-distended MUSCULOSKELETAL:  No edema; No deformity  SKIN: Warm and dry NEUROLOGIC:  Alert and oriented x 3  PSYCHIATRIC:  Normal affect    Signed, Redell Leiter, MD  09/02/2023 3:50 PM    [facility]

## 2023-09-02 ENCOUNTER — Ambulatory Visit: Payer: Managed Care, Other (non HMO) | Attending: Cardiology | Admitting: Cardiology

## 2023-09-02 ENCOUNTER — Encounter: Payer: Self-pay | Admitting: Cardiology

## 2023-09-02 ENCOUNTER — Ambulatory Visit: Payer: Managed Care, Other (non HMO) | Attending: Cardiology

## 2023-09-02 VITALS — BP 128/82 | HR 83 | Ht 67.0 in | Wt 166.4 lb

## 2023-09-02 DIAGNOSIS — I255 Ischemic cardiomyopathy: Secondary | ICD-10-CM

## 2023-09-02 DIAGNOSIS — E782 Mixed hyperlipidemia: Secondary | ICD-10-CM

## 2023-09-02 DIAGNOSIS — I2510 Atherosclerotic heart disease of native coronary artery without angina pectoris: Secondary | ICD-10-CM | POA: Diagnosis not present

## 2023-09-02 DIAGNOSIS — I739 Peripheral vascular disease, unspecified: Secondary | ICD-10-CM

## 2023-09-02 DIAGNOSIS — J449 Chronic obstructive pulmonary disease, unspecified: Secondary | ICD-10-CM | POA: Diagnosis not present

## 2023-09-02 MED ORDER — METOPROLOL SUCCINATE ER 25 MG PO TB24
25.0000 mg | ORAL_TABLET | Freq: Every day | ORAL | 3 refills | Status: AC
Start: 2023-09-02 — End: ?

## 2023-09-02 NOTE — Patient Instructions (Signed)
 Medication Instructions:  Your physician has recommended you make the following change in your medication:   START: Toprol  XL 25 mg daily  *If you need a refill on your cardiac medications before your next appointment, please call your pharmacy*   Lab Work: None If you have labs (blood work) drawn today and your tests are completely normal, you will receive your results only by: MyChart Message (if you have MyChart) OR A paper copy in the mail If you have any lab test that is abnormal or we need to change your treatment, we will call you to review the results.   Testing/Procedures: Your physician has requested that you have a carotid duplex. This test is an ultrasound of the carotid arteries in your neck. It looks at blood flow through these arteries that supply the brain with blood. Allow one hour for this exam. There are no restrictions or special instructions.  A zio monitor was ordered today. It will remain on for 14 days. You will then return monitor and event diary in provided box. It takes 1-2 weeks for report to be downloaded and returned to us . We will call you with the results. If monitor falls off or has orange flashing light, please call Zio for further instructions.     Follow-Up: At [facility], you and your health needs are our priority.  As part of our continuing mission to provide you with exceptional heart care, we have created designated Provider Care Teams.  These Care Teams include your primary Cardiologist (physician) and Advanced Practice Providers (APPs -  Physician Assistants and Nurse Practitioners) who all work together to provide you with the care you need, when you need it.  We recommend signing up for the patient portal called MyChart.  Sign up information is provided on this After Visit Summary.  MyChart is used to connect with patients for Virtual Visits (Telemedicine).  Patients are able to view lab/test results, encounter notes, upcoming  appointments, etc.  Non-urgent messages can be sent to your provider as well.   To learn more about what you can do with MyChart, go to [email or web address].    Your next appointment:   3 month(s)  Provider:   Redell Leiter, MD    Other Instructions None

## 2023-09-02 NOTE — Addendum Note (Signed)
Addended by: Roxanne Mins I on: 09/02/2023 04:14 PM   Modules accepted: Orders

## 2023-09-24 ENCOUNTER — Ambulatory Visit: Payer: Managed Care, Other (non HMO) | Attending: Cardiology

## 2023-09-24 DIAGNOSIS — I2510 Atherosclerotic heart disease of native coronary artery without angina pectoris: Secondary | ICD-10-CM | POA: Diagnosis not present

## 2023-09-24 DIAGNOSIS — I739 Peripheral vascular disease, unspecified: Secondary | ICD-10-CM

## 2023-09-24 DIAGNOSIS — E782 Mixed hyperlipidemia: Secondary | ICD-10-CM

## 2023-09-24 DIAGNOSIS — I255 Ischemic cardiomyopathy: Secondary | ICD-10-CM

## 2023-09-24 DIAGNOSIS — J449 Chronic obstructive pulmonary disease, unspecified: Secondary | ICD-10-CM

## 2023-09-30 DIAGNOSIS — I255 Ischemic cardiomyopathy: Secondary | ICD-10-CM

## 2023-09-30 DIAGNOSIS — I2510 Atherosclerotic heart disease of native coronary artery without angina pectoris: Secondary | ICD-10-CM | POA: Diagnosis not present

## 2023-09-30 DIAGNOSIS — J449 Chronic obstructive pulmonary disease, unspecified: Secondary | ICD-10-CM

## 2023-09-30 DIAGNOSIS — R002 Palpitations: Secondary | ICD-10-CM

## 2023-09-30 DIAGNOSIS — E782 Mixed hyperlipidemia: Secondary | ICD-10-CM | POA: Diagnosis not present

## 2023-09-30 DIAGNOSIS — I739 Peripheral vascular disease, unspecified: Secondary | ICD-10-CM

## 2023-12-08 ENCOUNTER — Ambulatory Visit: Payer: Managed Care, Other (non HMO) | Admitting: Cardiology

## 2023-12-08 NOTE — Progress Notes (Unsigned)
 History of Present Illness: .   Matthew Patel is a 58 y.o. male with a past medical history of HFimpEF, CAD, migraines, PAD--followed by [facility], COPD, GERD, dyslipidemia, prior tobacco use.  09/02/2023 monitor average heart rate 76 bpm, predominant rhythm was sinus, 37 triggered events present with sinus rhythm or single PVCs 09/24/2023 carotid duplex no evidence of stenosis 08/27/2023 cardiac cath widely patent stent to his RCA, mild nonobstructive disease elsewhere 02/02/2021 left heart cath widely patent DES to the RCA 09/27/2020 echo EF 55 to 60%, positive RWMA 07/04/2020 echo EF 40 to 45%, positive RWMA, mild concentric LVH, mild hypokinesis of the LV with moderate hypokinesis of the LV basal-mid inferolateral wall 07/04/2020 left heart cath STEMI DES to the proximal to mid RCA  In 2021 he underwent a STEMI with subsequent DES to the RCA.  At that time his EF was diminished, started on GDMT with normalization of his EF.  Subsequent left heart cath in 2022 revealed widely patent RCA stent.  In January 2025 he began having concerns of chest pain, had mixed features but were concerning, repeat left heart cath revealed widely patent stent to his RCA and mild nonobstructive disease elsewhere.  He was evaluated by Dr. Sandee Crook following his most recent cath, concerns of palpitations a monitor was arranged revealing episodes of PVCs.  He presents today accompanied his wife for follow-up of his CAD.  He has been doing well since he was last evaluated in our office.  He is not currently bothered as often by his palpitations as he frequently was, he is just very sensitive and aware to any palpitations.  He has not participated in formal exercise however he is very physically active at work and is tolerating all this without episodes of chest pain. He denies chest pain, palpitations, dyspnea, pnd, orthopnea, n, v, dizziness, syncope, edema, weight gain, or early satiety.    ROS: Review of Systems  Constitutional:   Positive for malaise/fatigue.  Respiratory:  Positive for shortness of breath.   Cardiovascular:  Positive for chest pain and palpitations.  Neurological:  Positive for dizziness.  All other systems reviewed and are negative.    Studies Reviewed: .        Cardiac Studies & Procedures   ______________________________________________________________________________________________ CARDIAC CATHETERIZATION  CARDIAC CATHETERIZATION 08/27/2023  Conclusion 1.  Widely patent left main with no stenosis 2.  Widely patent LAD with mild irregularity but no significant stenosis.  Patent diagonal branches. 3.  Patent circumflex supplying a single OM branch with no stenosis 4.  Large, dominant RCA with widely patent proximal stent, no in-stent restenosis, mild plaquing proximal to the stent with no more than 30% stenosis  Recommendations: Ongoing medical therapy.  The patient has widely patent coronary arteries with no obstructive disease.  Findings Coronary Findings Diagnostic  Dominance: Right  Left Anterior Descending There is mild diffuse disease throughout the vessel. Ost LAD to Prox LAD lesion is 10% stenosed. Prox LAD to Mid LAD lesion is 20% stenosed.  Left Circumflex Prox Cx to Mid Cx lesion is 30% stenosed.  Right Coronary Artery Ost RCA to Prox RCA lesion is 30% stenosed. Suspected catheter induced spasm Non-stenotic Prox RCA to Mid RCA lesion was previously treated.  Right Ventricular Branch Vessel is small in size.  Right Posterior Descending Artery  Intervention  No interventions have been documented.   CARDIAC CATHETERIZATION  CARDIAC CATHETERIZATION 02/02/2021  Conclusion  Ost LAD to Prox LAD lesion is 10% stenosed.  Prox LAD to Mid  LAD lesion is 20% stenosed.  Non-stenotic Prox RCA to Mid RCA lesion was previously treated.  The left ventricular systolic function is normal.  LV end diastolic pressure is normal.  The left ventricular ejection fraction is  55-65% by visual estimate.  Prox RCA lesion is 30% stenosed.  1.  Widely patent RCA stent with no significant restenosis.  Mild stenosis proximal to the RCA stent thought to be due to catheter induced spasm.  No obstructive disease affecting the left system. 2.  Normal LV systolic function and normal left ventricular end-diastolic pressure. 3.  Moderate difficulty of catheterization via the right radial artery due to somewhat left-sided origin of the innominate artery with tortuosity.  Recommendations: Continue medical therapy.  Findings Coronary Findings Diagnostic  Dominance: Right  Left Anterior Descending Ost LAD to Prox LAD lesion is 10% stenosed. Prox LAD to Mid LAD lesion is 20% stenosed.  Right Coronary Artery Prox RCA lesion is 30% stenosed. Suspected catheter induced spasm Non-stenotic Prox RCA to Mid RCA lesion was previously treated.  Right Ventricular Branch Vessel is small in size.  Right Posterior Descending Artery  Intervention  No interventions have been documented.     ECHOCARDIOGRAM  ECHOCARDIOGRAM COMPLETE 09/27/2020  Narrative ECHOCARDIOGRAM REPORT    Patient Name:   Matthew Patel Date of Exam: 09/27/2020 Medical Rec #:  [id number]       Height:       68.0 in Accession #:    [id number]      Weight:       153.0 lb Date of Birth:  17-Sep-1965       BSA:          1.824 m Patient Age:    54 years        BP:           128/84 mmHg Patient Gender: M               HR:           61 bpm. Exam Location:  [place]  Procedure: 2D Echo, 3D Echo, Cardiac Doppler and Color Doppler  Indications:    I25.5 Ischemic cardiomyopathy  History:        Patient has prior history of Echocardiogram examinations, most recent 07/04/2020. Cardiomyopathy, CAD and Previous Myocardial Infarction; Risk Factors:Dyslipidemia.  Sonographer:    Melinda Sprawls Referring Phys: [phone] BRIAN J MUNLEY  IMPRESSIONS   1. Left ventricular ejection fraction, by estimation, is 55 to  60%. The left ventricle has normal function. The left ventricle demonstrates regional wall motion abnormalities (see scoring diagram/findings for description). Left ventricular diastolic parameters were normal. 2. Right ventricular systolic function is normal. The right ventricular size is normal. 3. The mitral valve is normal in structure. No evidence of mitral valve regurgitation. No evidence of mitral stenosis. 4. The aortic valve is tricuspid. Aortic valve regurgitation is not visualized. No aortic stenosis is present. 5. The inferior vena cava is normal in size with greater than 50% respiratory variability, suggesting right atrial pressure of 3 mmHg.  FINDINGS Left Ventricle: Left ventricular ejection fraction, by estimation, is 55 to 60%. The left ventricle has normal function. The left ventricle demonstrates regional wall motion abnormalities. The left ventricular internal cavity size was normal in size. There is no left ventricular hypertrophy. Left ventricular diastolic parameters were normal. Normal left ventricular filling pressure.   LV Wall Scoring: The basal inferior segment is hypokinetic. The entire anterior wall, entire lateral wall, entire septum, entire apex,  and mid and distal inferior wall are normal.  Right Ventricle: The right ventricular size is normal. No increase in right ventricular wall thickness. Right ventricular systolic function is normal.  Left Atrium: Left atrial size was normal in size.  Right Atrium: Right atrial size was normal in size.  Pericardium: There is no evidence of pericardial effusion.  Mitral Valve: The mitral valve is normal in structure. No evidence of mitral valve regurgitation. No evidence of mitral valve stenosis.  Tricuspid Valve: The tricuspid valve is normal in structure. Tricuspid valve regurgitation is not demonstrated. No evidence of tricuspid stenosis.  Aortic Valve: The aortic valve is tricuspid. Aortic valve regurgitation is  not visualized. No aortic stenosis is present.  Pulmonic Valve: The pulmonic valve was normal in structure. Pulmonic valve regurgitation is not visualized. No evidence of pulmonic stenosis.  Aorta: The aortic root, ascending aorta, aortic arch and descending aorta are all structurally normal, with no evidence of dilitation or obstruction.  Venous: A normal flow pattern is recorded from the right upper pulmonary vein. The inferior vena cava is normal in size with greater than 50% respiratory variability, suggesting right atrial pressure of 3 mmHg.  IAS/Shunts: No atrial level shunt detected by color flow Doppler.   LEFT VENTRICLE PLAX 2D LVIDd:         4.68 cm  Diastology LVIDs:         3.72 cm  LV e' medial:    7.62 cm/s LV PW:         1.01 cm  LV E/e' medial:  8.0 LV IVS:        0.88 cm  LV e' lateral:   11.20 cm/s LVOT diam:     2.00 cm  LV E/e' lateral: 5.5 LV SV:         43 LV SV Index:   23 LVOT Area:     3.14 cm  3D Volume EF: 3D EF:        46 % LV EDV:       152 ml LV ESV:       82 ml LV SV:        70 ml  RIGHT VENTRICLE RV S prime:     9.46 cm/s TAPSE (M-mode): 1.9 cm  LEFT ATRIUM             Index       RIGHT ATRIUM          Index LA diam:        3.40 cm 1.86 cm/m  RA Area:     9.79 cm LA Vol (A2C):   28.0 ml 15.35 ml/m RA Volume:   17.60 ml 9.65 ml/m LA Vol (A4C):   20.5 ml 11.24 ml/m LA Biplane Vol: 24.3 ml 13.32 ml/m AORTIC VALVE LVOT Vmax:   71.10 cm/s LVOT Vmean:  50.300 cm/s LVOT VTI:    0.136 m  AORTA Ao Root diam: 3.20 cm Ao Asc diam:  3.00 cm  MITRAL VALVE MV Area (PHT): 5.02 cm    SHUNTS MV Decel Time: 151 msec    Systemic VTI:  0.14 m MV E velocity: 61.30 cm/s  Systemic Diam: 2.00 cm MV A velocity: 56.10 cm/s MV E/A ratio:  1.09  Zoe Hinds MD Electronically signed by Zoe Hinds MD Signature Date/Time: 09/27/2020/12:47:32 PM    Final    MONITORS  LONG TERM MONITOR-LIVE TELEMETRY (3-14 DAYS) 09/29/2023  Narrative Patch Wear  Time:  14 days and 3 hours (2025-02-04T16:25:36-0500 to 2025-02-18T20:19:04-499)  Monitor 1 Patient had a min HR of 52 bpm, max HR of 190 bpm, and avg HR of 81 bpm. Predominant underlying rhythm was Sinus Rhythm.  There were 76 triggered events all sinus rhythm many had occasional PVCs of 1 associated with a 4 beat run of atrial premature contractions.  There were no sinus pauses of 3 seconds or greater and no episodes of second or third-degree AV block.  No episodes of atrial fibrillation or flutter.  1 run of APCs occurred lasting 4 beats with a max rate of 190 bpm (avg 171 bpm). Supraventricular Tachycardia was detected within +/- 45 seconds of symptomatic patient event(s).  Isolated SVEs were rare (<1.0%), SVE Couplets were rare (<1.0%), and SVE Triplets were rare (<1.0%).  Isolated VEs were rare (<1.0%), VE Couplets were rare (<1.0%), and no VE Triplets were present. Ventricular Bigeminy and Trigeminy were present.  Monitor 2 Patient had a min HR of 50 bpm, max HR of 156 bpm, and avg HR of 76 bpm. Predominant underlying rhythm was Sinus Rhythm. First Degree AV Block was present.  There were 37 triggered events present all sinus rhythm.  Predominantly associated with single PVCs.  There were no sinus pauses 3 seconds or greater no episodes of second or third-degree AV block.  There were no episodes of atrial fibrillation or flutter.  1 run of APCs 76 triggered events were present all sinus rhythm min he had single PVCs and 1 was a brief run of atrial premature contractions.  Occurred lasting 4 beats with a max rate of 156 bpm (avg 142 bpm).  Isolated SVEs were rare (<1.0%), SVE Couplets were rare (<1.0%), and SVE Triplets were rare (<1.0%).  Isolated VEs were rare (<1.0%), VE Couplets were rare (<1.0%), and no VE Triplets were present.       ______________________________________________________________________________________________      Risk Assessment/Calculations:             Physical Exam:   VS:  BP (!) 120/90   Pulse 75   Ht 5\' 7"  (1.702 m)   Wt 166 lb (75.3 kg)   SpO2 94%   BMI 26.00 kg/m    Wt Readings from Last 3 Encounters:  12/09/23 166 lb (75.3 kg)  09/02/23 166 lb 6.4 oz (75.5 kg)  08/27/23 166 lb (75.3 kg)    GEN: Well nourished, well developed in no acute distress NECK: No JVD; No carotid bruits CARDIAC: RRR, no murmurs, rubs, gallops RESPIRATORY: Velcro-like crackles in the bases ABDOMEN: Soft, non-tender, non-distended EXTREMITIES:  No edema; No deformity   ASSESSMENT AND PLAN: .   CAD -s/p DES to the RCA in 2021 in the setting of a STEMI >> repeat cath in 2022 revealed patent stent >> repeat left heart cath 08/27/2023 widely patent stent, mild nonobstructive CAD.  Continue Norvasc  5 mg daily, continue aspirin  81 mg daily, continue Lipitor  40 mg daily, continue metoprolol  25 mg daily (currently taking 25 mg 2 days ago and on the third day 12.5 mg).  Ischemic cardiomyopathy-NYHA class I.  Euvolemic.  Continue metoprolol .  Palpitations-monitor revealed single PVCs, he is very sensitive to palpitations.  Currently he is taking metoprolol  25 mg on 2 days and on the third day he takes 12.5 mg and this appears to be working well for him.  Dyslipidemia-most recent LDL was controlled at 53 on 02/27/2023.  Continue Lipitor  40 mg daily.  LP(a) was normal.  COPD with prior tobacco abuse-tobacco cessation since 2021, he does have some adventitious lung sounds  however has not been evaluated by pulmonologist, he is not bothered by shortness of breath at this time.   PAD-follows with vascular, Dr. Charlotte Cookey.     Dispo: 14-month follow-up.   Signed, Terrance Ferretti, NP

## 2023-12-09 ENCOUNTER — Ambulatory Visit: Attending: Cardiology | Admitting: Cardiology

## 2023-12-09 ENCOUNTER — Encounter: Payer: Self-pay | Admitting: Cardiology

## 2023-12-09 VITALS — BP 120/90 | HR 75 | Ht 67.0 in | Wt 166.0 lb

## 2023-12-09 DIAGNOSIS — E782 Mixed hyperlipidemia: Secondary | ICD-10-CM | POA: Diagnosis not present

## 2023-12-09 DIAGNOSIS — I255 Ischemic cardiomyopathy: Secondary | ICD-10-CM | POA: Diagnosis not present

## 2023-12-09 DIAGNOSIS — R002 Palpitations: Secondary | ICD-10-CM

## 2023-12-09 DIAGNOSIS — I2510 Atherosclerotic heart disease of native coronary artery without angina pectoris: Secondary | ICD-10-CM | POA: Diagnosis not present

## 2023-12-09 DIAGNOSIS — J449 Chronic obstructive pulmonary disease, unspecified: Secondary | ICD-10-CM

## 2023-12-09 DIAGNOSIS — I739 Peripheral vascular disease, unspecified: Secondary | ICD-10-CM

## 2023-12-09 NOTE — Patient Instructions (Signed)

## 2024-01-01 ENCOUNTER — Other Ambulatory Visit: Payer: Self-pay | Admitting: Physician Assistant

## 2024-01-01 DIAGNOSIS — I739 Peripheral vascular disease, unspecified: Secondary | ICD-10-CM

## 2024-01-01 DIAGNOSIS — I70219 Atherosclerosis of native arteries of extremities with intermittent claudication, unspecified extremity: Secondary | ICD-10-CM

## 2024-06-06 NOTE — Progress Notes (Unsigned)
 Cardiology Office Note:    Date:  06/06/2024   ID:  Matthew Patel, DOB [birth year], MRN [id number]  PCP:  Charlann Knee, PA  Cardiologist:  Redell Leiter, MD    Referring MD: Charlann Knee, PA    ASSESSMENT:    1. CAD in native artery   2. Cardiomyopathy, ischemic   3. Mixed hyperlipidemia   4. PAD (peripheral artery disease)   5. Chronic obstructive pulmonary disease, unspecified COPD type (HCC)    PLAN:    In order of problems listed above:  ***   Next appointment: ***   Medication Adjustments/Labs and Tests Ordered: Current medicines are reviewed at length with the patient today.  Concerns regarding medicines are outlined above.  No orders of the defined types were placed in this encounter.  No orders of the defined types were placed in this encounter.    History of Present Illness:    Matthew Patel is a 58 y.o. male with a hx of CAD with previous MI PCI stent severe LV dysfunction subsequent normalization with guideline directed therapy hyperlipidemia COPD and peripheral vascular disease last seen 12/09/2023 by Delon Hoover nurse practitioner. Compliance with diet, lifestyle and medications: *** Past Medical History:  Diagnosis Date   Atherosclerotic heart disease of native coronary artery with other forms of angina pectoris    CAD in native artery 07/05/2020   Cardiomyopathy, ischemic 07/05/2020   COPD (chronic obstructive pulmonary disease) (HCC) 11/12/2019   GERD (gastroesophageal reflux disease)    Migraines    Mixed hyperlipidemia    PAD (peripheral artery disease) 02/12/2022   Plantar fasciitis of left foot 02/08/2020   Psoriasis 02/12/2022   S/P angioplasty with stent RCA DES 07/04/20 07/05/2020   Tarsal tunnel syndrome of left side 02/08/2020   Tendonitis, Achilles, left 02/08/2020   Tension type headache 04/04/2015    Current Medications: No outpatient medications have been marked as taking for the 06/07/24 encounter (Appointment) with  Leiter Redell PARAS, MD.   Current Facility-Administered Medications for the 06/07/24 encounter (Appointment) with Leiter Redell PARAS, MD  Medication   sodium chloride  flush (NS) 0.9 % injection 3 mL      EKGs/Labs/Other Studies Reviewed:    The following studies were reviewed today:  Cardiac Studies & Procedures   ______________________________________________________________________________________________ CARDIAC CATHETERIZATION  CARDIAC CATHETERIZATION 08/27/2023  Conclusion 1.  Widely patent left main with no stenosis 2.  Widely patent LAD with mild irregularity but no significant stenosis.  Patent diagonal branches. 3.  Patent circumflex supplying a single OM branch with no stenosis 4.  Large, dominant RCA with widely patent proximal stent, no in-stent restenosis, mild plaquing proximal to the stent with no more than 30% stenosis  Recommendations: Ongoing medical therapy.  The patient has widely patent coronary arteries with no obstructive disease.  Findings Coronary Findings Diagnostic  Dominance: Right  Left Anterior Descending There is mild diffuse disease throughout the vessel. Ost LAD to Prox LAD lesion is 10% stenosed. Prox LAD to Mid LAD lesion is 20% stenosed.  Left Circumflex Prox Cx to Mid Cx lesion is 30% stenosed.  Right Coronary Artery Ost RCA to Prox RCA lesion is 30% stenosed. Suspected catheter induced spasm Non-stenotic Prox RCA to Mid RCA lesion was previously treated.  Right Ventricular Branch Vessel is small in size.  Right Posterior Descending Artery  Intervention  No interventions have been documented.   CARDIAC CATHETERIZATION  CARDIAC CATHETERIZATION 02/02/2021  Conclusion  Ost LAD to Prox LAD lesion is 10% stenosed.  Prox  LAD to Mid LAD lesion is 20% stenosed.  Non-stenotic Prox RCA to Mid RCA lesion was previously treated.  The left ventricular systolic function is normal.  LV end diastolic pressure is normal.  The left  ventricular ejection fraction is 55-65% by visual estimate.  Prox RCA lesion is 30% stenosed.  1.  Widely patent RCA stent with no significant restenosis.  Mild stenosis proximal to the RCA stent thought to be due to catheter induced spasm.  No obstructive disease affecting the left system. 2.  Normal LV systolic function and normal left ventricular end-diastolic pressure. 3.  Moderate difficulty of catheterization via the right radial artery due to somewhat left-sided origin of the innominate artery with tortuosity.  Recommendations: Continue medical therapy.  Findings Coronary Findings Diagnostic  Dominance: Right  Left Anterior Descending Ost LAD to Prox LAD lesion is 10% stenosed. Prox LAD to Mid LAD lesion is 20% stenosed.  Right Coronary Artery Prox RCA lesion is 30% stenosed. Suspected catheter induced spasm Non-stenotic Prox RCA to Mid RCA lesion was previously treated.  Right Ventricular Branch Vessel is small in size.  Right Posterior Descending Artery  Intervention  No interventions have been documented.     ECHOCARDIOGRAM  ECHOCARDIOGRAM COMPLETE 09/27/2020  Narrative ECHOCARDIOGRAM REPORT    Patient Name:   Matthew Patel Date of Exam: 09/27/2020 Medical Rec #:  [id number]       Height:       68.0 in Accession #:    [id number]      Weight:       153.0 lb Date of Birth:  [birth year]       BSA:          1.824 m Patient Age:    58 years        BP:           128/84 mmHg Patient Gender: M               HR:           61 bpm. Exam Location:  [place]  Procedure: 2D Echo, 3D Echo, Cardiac Doppler and Color Doppler  Indications:    I25.5 Ischemic cardiomyopathy  History:        Patient has prior history of Echocardiogram examinations, most recent 07/04/2020. Cardiomyopathy, CAD and Previous Myocardial Infarction; Risk Factors:Dyslipidemia.  Sonographer:    Apolinar Levins Referring Phys: [phone] [place] Greogry Goodwyn  IMPRESSIONS   1. Left ventricular ejection  fraction, by estimation, is 55 to 60%. The left ventricle has normal function. The left ventricle demonstrates regional wall motion abnormalities (see scoring diagram/findings for description). Left ventricular diastolic parameters were normal. 2. Right ventricular systolic function is normal. The right ventricular size is normal. 3. The mitral valve is normal in structure. No evidence of mitral valve regurgitation. No evidence of mitral stenosis. 4. The aortic valve is tricuspid. Aortic valve regurgitation is not visualized. No aortic stenosis is present. 5. The inferior vena cava is normal in size with greater than 50% respiratory variability, suggesting right atrial pressure of 3 mmHg.  FINDINGS Left Ventricle: Left ventricular ejection fraction, by estimation, is 55 to 60%. The left ventricle has normal function. The left ventricle demonstrates regional wall motion abnormalities. The left ventricular internal cavity size was normal in size. There is no left ventricular hypertrophy. Left ventricular diastolic parameters were normal. Normal left ventricular filling pressure.   LV Wall Scoring: The basal inferior segment is hypokinetic. The entire anterior wall, entire lateral wall, entire  septum, entire apex, and mid and distal inferior wall are normal.  Right Ventricle: The right ventricular size is normal. No increase in right ventricular wall thickness. Right ventricular systolic function is normal.  Left Atrium: Left atrial size was normal in size.  Right Atrium: Right atrial size was normal in size.  Pericardium: There is no evidence of pericardial effusion.  Mitral Valve: The mitral valve is normal in structure. No evidence of mitral valve regurgitation. No evidence of mitral valve stenosis.  Tricuspid Valve: The tricuspid valve is normal in structure. Tricuspid valve regurgitation is not demonstrated. No evidence of tricuspid stenosis.  Aortic Valve: The aortic valve is  tricuspid. Aortic valve regurgitation is not visualized. No aortic stenosis is present.  Pulmonic Valve: The pulmonic valve was normal in structure. Pulmonic valve regurgitation is not visualized. No evidence of pulmonic stenosis.  Aorta: The aortic root, ascending aorta, aortic arch and descending aorta are all structurally normal, with no evidence of dilitation or obstruction.  Venous: A normal flow pattern is recorded from the right upper pulmonary vein. The inferior vena cava is normal in size with greater than 50% respiratory variability, suggesting right atrial pressure of 3 mmHg.  IAS/Shunts: No atrial level shunt detected by color flow Doppler.   LEFT VENTRICLE PLAX 2D LVIDd:         4.68 cm  Diastology LVIDs:         3.72 cm  LV e' medial:    7.62 cm/s LV PW:         1.01 cm  LV E/e' medial:  8.0 LV IVS:        0.88 cm  LV e' lateral:   11.20 cm/s LVOT diam:     2.00 cm  LV E/e' lateral: 5.5 LV SV:         43 LV SV Index:   23 LVOT Area:     3.14 cm  3D Volume EF: 3D EF:        46 % LV EDV:       152 ml LV ESV:       82 ml LV SV:        70 ml  RIGHT VENTRICLE RV S prime:     9.46 cm/s TAPSE (M-mode): 1.9 cm  LEFT ATRIUM             Index       RIGHT ATRIUM          Index LA diam:        3.40 cm 1.86 cm/m  RA Area:     9.79 cm LA Vol (A2C):   28.0 ml 15.35 ml/m RA Volume:   17.60 ml 9.65 ml/m LA Vol (A4C):   20.5 ml 11.24 ml/m LA Biplane Vol: 24.3 ml 13.32 ml/m AORTIC VALVE LVOT Vmax:   71.10 cm/s LVOT Vmean:  50.300 cm/s LVOT VTI:    0.136 m  AORTA Ao Root diam: 3.20 cm Ao Asc diam:  3.00 cm  MITRAL VALVE MV Area (PHT): 5.02 cm    SHUNTS MV Decel Time: 151 msec    Systemic VTI:  0.14 m MV E velocity: 61.30 cm/s  Systemic Diam: 2.00 cm MV A velocity: 56.10 cm/s MV E/A ratio:  1.09  Redell Leiter MD Electronically signed by Redell Leiter MD Signature Date/Time: 09/27/2020/12:47:32 PM    Final    MONITORS  LONG TERM MONITOR-LIVE TELEMETRY  (3-14 DAYS) 09/29/2023  Narrative Patch Wear Time:  14 days and 3 hours (  2025-02-04T16:25:36-0500 to 2025-02-18T20:19:04-499)  Monitor 1 Patient had a min HR of 52 bpm, max HR of 190 bpm, and avg HR of 81 bpm. Predominant underlying rhythm was Sinus Rhythm.  There were 76 triggered events all sinus rhythm many had occasional PVCs of 1 associated with a 4 beat run of atrial premature contractions.  There were no sinus pauses of 3 seconds or greater and no episodes of second or third-degree AV block.  No episodes of atrial fibrillation or flutter.  1 run of APCs occurred lasting 4 beats with a max rate of 190 bpm (avg 171 bpm). Supraventricular Tachycardia was detected within +/- 45 seconds of symptomatic patient event(s).  Isolated SVEs were rare (<1.0%), SVE Couplets were rare (<1.0%), and SVE Triplets were rare (<1.0%).  Isolated VEs were rare (<1.0%), VE Couplets were rare (<1.0%), and no VE Triplets were present. Ventricular Bigeminy and Trigeminy were present.  Monitor 2 Patient had a min HR of 50 bpm, max HR of 156 bpm, and avg HR of 76 bpm. Predominant underlying rhythm was Sinus Rhythm. First Degree AV Block was present.  There were 37 triggered events present all sinus rhythm.  Predominantly associated with single PVCs.  There were no sinus pauses 3 seconds or greater no episodes of second or third-degree AV block.  There were no episodes of atrial fibrillation or flutter.  1 run of APCs 76 triggered events were present all sinus rhythm min he had single PVCs and 1 was a brief run of atrial premature contractions.  Occurred lasting 4 beats with a max rate of 156 bpm (avg 142 bpm).  Isolated SVEs were rare (<1.0%), SVE Couplets were rare (<1.0%), and SVE Triplets were rare (<1.0%).  Isolated VEs were rare (<1.0%), VE Couplets were rare (<1.0%), and no VE Triplets were present.        ______________________________________________________________________________________________          Recent Labs: 08/25/2023: BUN 11; Creatinine, Ser 0.90; Hemoglobin 13.9; Platelets 269; Potassium 3.9; Sodium 140  Recent Lipid Panel    Component Value Date/Time   CHOL 114 02/27/2023 1631   TRIG 116 02/27/2023 1631   HDL 40 02/27/2023 1631   CHOLHDL 2.9 02/27/2023 1631   CHOLHDL 5.7 07/04/2020 0200   VLDL 35 07/04/2020 0200   LDLCALC 53 02/27/2023 1631    Physical Exam:    VS:  There were no vitals taken for this visit.    Wt Readings from Last 3 Encounters:  12/09/23 166 lb (75.3 kg)  09/02/23 166 lb 6.4 oz (75.5 kg)  08/27/23 166 lb (75.3 kg)     GEN: *** Well nourished, well developed in no acute distress HEENT: Normal NECK: No JVD; No carotid bruits LYMPHATICS: No lymphadenopathy CARDIAC: ***RRR, no murmurs, rubs, gallops RESPIRATORY:  Clear to auscultation without rales, wheezing or rhonchi  ABDOMEN: Soft, non-tender, non-distended MUSCULOSKELETAL:  No edema; No deformity  SKIN: Warm and dry NEUROLOGIC:  Alert and oriented x 3 PSYCHIATRIC:  Normal affect    Signed, Redell Leiter, MD  06/06/2024 10:24 AM    [facility]

## 2024-06-07 ENCOUNTER — Ambulatory Visit: Attending: Cardiology | Admitting: Cardiology

## 2024-06-07 ENCOUNTER — Encounter: Payer: Self-pay | Admitting: Cardiology

## 2024-06-07 VITALS — BP 124/82 | HR 70 | Ht 67.0 in | Wt 175.6 lb

## 2024-06-07 DIAGNOSIS — E782 Mixed hyperlipidemia: Secondary | ICD-10-CM | POA: Diagnosis not present

## 2024-06-07 DIAGNOSIS — I2510 Atherosclerotic heart disease of native coronary artery without angina pectoris: Secondary | ICD-10-CM | POA: Diagnosis not present

## 2024-06-07 DIAGNOSIS — I255 Ischemic cardiomyopathy: Secondary | ICD-10-CM

## 2024-06-07 DIAGNOSIS — I739 Peripheral vascular disease, unspecified: Secondary | ICD-10-CM | POA: Diagnosis not present

## 2024-06-07 DIAGNOSIS — J449 Chronic obstructive pulmonary disease, unspecified: Secondary | ICD-10-CM

## 2024-06-07 NOTE — Patient Instructions (Signed)
 Medication Instructions:  Your physician recommends that you continue on your current medications as directed. Please refer to the Current Medication list given to you today.  *If you need a refill on your cardiac medications before your next appointment, please call your pharmacy*  Lab Work: Your physician recommends that you return for lab work in:   Labs today: CMP, Lipids, Apo B  If you have labs (blood work) drawn today and your tests are completely normal, you will receive your results only by: MyChart Message (if you have MyChart) OR A paper copy in the mail If you have any lab test that is abnormal or we need to change your treatment, we will call you to review the results.  Testing/Procedures: None  Follow-Up: At [facility], you and your health needs are our priority.  As part of our continuing mission to provide you with exceptional heart care, our providers are all part of one team.  This team includes your primary Cardiologist (physician) and Advanced Practice Providers or APPs (Physician Assistants and Nurse Practitioners) who all work together to provide you with the care you need, when you need it.  Your next appointment:   1 year(s)  Provider:   Norman Herrlich, MD    We recommend signing up for the patient portal called "MyChart".  Sign up information is provided on this After Visit Summary.  MyChart is used to connect with patients for Virtual Visits (Telemedicine).  Patients are able to view lab/test results, encounter notes, upcoming appointments, etc.  Non-urgent messages can be sent to your provider as well.   To learn more about what you can do with MyChart, go to [email or web address].   Other Instructions None

## 2024-06-07 NOTE — Addendum Note (Signed)
 Addended by: SHERRE ADE I on: 06/07/2024 04:59 PM   Modules accepted: Orders

## 2024-06-12 LAB — COMPREHENSIVE METABOLIC PANEL WITH GFR
ALT: 29 IU/L (ref 0–44)
AST: 18 IU/L (ref 0–40)
Albumin: 4.6 g/dL (ref 3.8–4.9)
Alkaline Phosphatase: 59 IU/L (ref 47–123)
BUN/Creatinine Ratio: 16 (ref 9–20)
BUN: 15 mg/dL (ref 6–24)
Bilirubin Total: 0.7 mg/dL (ref 0.0–1.2)
CO2: 24 mmol/L (ref 20–29)
Calcium: 9.6 mg/dL (ref 8.7–10.2)
Chloride: 104 mmol/L (ref 96–106)
Creatinine, Ser: 0.96 mg/dL (ref 0.76–1.27)
Globulin, Total: 2.5 g/dL (ref 1.5–4.5)
Glucose: 113 mg/dL — ABNORMAL HIGH (ref 70–99)
Potassium: 4.6 mmol/L (ref 3.5–5.2)
Sodium: 141 mmol/L (ref 134–144)
Total Protein: 7.1 g/dL (ref 6.0–8.5)
eGFR: 93 mL/min/1.73 (ref >59)

## 2024-06-12 LAB — LIPID PANEL
Chol/HDL Ratio: 2.8 ratio (ref 0.0–5.0)
Cholesterol, Total: 99 mg/dL — ABNORMAL LOW (ref 100–199)
HDL: 35 mg/dL — ABNORMAL LOW (ref >39)
LDL Chol Calc (NIH): 47 mg/dL (ref 0–99)
Triglycerides: 86 mg/dL (ref 0–149)
VLDL Cholesterol Cal: 17 mg/dL (ref 5–40)

## 2024-06-12 LAB — APOLIPOPROTEIN B: Apolipoprotein B: 44 mg/dL (ref <90)

## 2024-06-13 ENCOUNTER — Encounter: Payer: Self-pay | Admitting: Cardiology
